# Patient Record
Sex: Female | Born: 1950 | Race: White | Hispanic: No | Marital: Married | Smoking: Former smoker
Health system: Southern US, Community
[De-identification: ages and names within clinical notes are randomized; demographics above are authoritative.]

## PROBLEM LIST (undated history)

## (undated) DIAGNOSIS — I10 Essential (primary) hypertension: Secondary | ICD-10-CM

## (undated) DIAGNOSIS — R519 Headache, unspecified: Secondary | ICD-10-CM

## (undated) DIAGNOSIS — R51 Headache: Secondary | ICD-10-CM

## (undated) DIAGNOSIS — R011 Cardiac murmur, unspecified: Secondary | ICD-10-CM

## (undated) DIAGNOSIS — L709 Acne, unspecified: Secondary | ICD-10-CM

## (undated) DIAGNOSIS — J42 Unspecified chronic bronchitis: Secondary | ICD-10-CM

## (undated) DIAGNOSIS — M1990 Unspecified osteoarthritis, unspecified site: Secondary | ICD-10-CM

## (undated) DIAGNOSIS — D649 Anemia, unspecified: Secondary | ICD-10-CM

## (undated) DIAGNOSIS — E119 Type 2 diabetes mellitus without complications: Secondary | ICD-10-CM

## (undated) DIAGNOSIS — K219 Gastro-esophageal reflux disease without esophagitis: Secondary | ICD-10-CM

## (undated) DIAGNOSIS — E7800 Pure hypercholesterolemia, unspecified: Secondary | ICD-10-CM

## (undated) DIAGNOSIS — E039 Hypothyroidism, unspecified: Secondary | ICD-10-CM

## (undated) HISTORY — PX: BACK SURGERY: SHX140

## (undated) HISTORY — PX: JOINT REPLACEMENT: SHX530

## (undated) HISTORY — DX: Anemia, unspecified: D64.9

## (undated) HISTORY — DX: Type 2 diabetes mellitus without complications: E11.9

## (undated) HISTORY — PX: LAPAROSCOPIC CHOLECYSTECTOMY: SUR755

---

## 1975-02-16 HISTORY — PX: TUBAL LIGATION: SHX77

## 2004-02-16 HISTORY — PX: ANTERIOR CERVICAL DECOMP/DISCECTOMY FUSION: SHX1161

## 2004-07-03 ENCOUNTER — Ambulatory Visit (HOSPITAL_COMMUNITY): Admission: AD | Admit: 2004-07-03 | Discharge: 2004-07-05 | Payer: Self-pay | Admitting: Neurosurgery

## 2015-02-16 HISTORY — PX: TOTAL ABDOMINAL HYSTERECTOMY: SHX209

## 2016-01-29 DIAGNOSIS — R42 Dizziness and giddiness: Secondary | ICD-10-CM | POA: Diagnosis not present

## 2017-02-17 DIAGNOSIS — M542 Cervicalgia: Secondary | ICD-10-CM | POA: Diagnosis not present

## 2017-02-18 DIAGNOSIS — M542 Cervicalgia: Secondary | ICD-10-CM | POA: Diagnosis not present

## 2017-02-26 DIAGNOSIS — J209 Acute bronchitis, unspecified: Secondary | ICD-10-CM | POA: Diagnosis not present

## 2017-02-26 DIAGNOSIS — J019 Acute sinusitis, unspecified: Secondary | ICD-10-CM | POA: Diagnosis not present

## 2017-02-28 DIAGNOSIS — M542 Cervicalgia: Secondary | ICD-10-CM | POA: Diagnosis not present

## 2017-03-04 DIAGNOSIS — M542 Cervicalgia: Secondary | ICD-10-CM | POA: Diagnosis not present

## 2017-03-07 DIAGNOSIS — M542 Cervicalgia: Secondary | ICD-10-CM | POA: Diagnosis not present

## 2017-03-10 DIAGNOSIS — M542 Cervicalgia: Secondary | ICD-10-CM | POA: Diagnosis not present

## 2017-04-07 DIAGNOSIS — Z6839 Body mass index (BMI) 39.0-39.9, adult: Secondary | ICD-10-CM | POA: Diagnosis not present

## 2017-04-07 DIAGNOSIS — E6609 Other obesity due to excess calories: Secondary | ICD-10-CM | POA: Diagnosis not present

## 2017-04-07 DIAGNOSIS — Z1231 Encounter for screening mammogram for malignant neoplasm of breast: Secondary | ICD-10-CM | POA: Diagnosis not present

## 2017-04-07 DIAGNOSIS — N958 Other specified menopausal and perimenopausal disorders: Secondary | ICD-10-CM | POA: Diagnosis not present

## 2017-04-07 DIAGNOSIS — M205X9 Other deformities of toe(s) (acquired), unspecified foot: Secondary | ICD-10-CM | POA: Diagnosis not present

## 2017-04-07 DIAGNOSIS — Z122 Encounter for screening for malignant neoplasm of respiratory organs: Secondary | ICD-10-CM | POA: Diagnosis not present

## 2017-04-07 DIAGNOSIS — Z23 Encounter for immunization: Secondary | ICD-10-CM | POA: Diagnosis not present

## 2017-04-07 DIAGNOSIS — Z0000 Encounter for general adult medical examination without abnormal findings: Secondary | ICD-10-CM | POA: Diagnosis not present

## 2017-04-07 DIAGNOSIS — N3941 Urge incontinence: Secondary | ICD-10-CM | POA: Diagnosis not present

## 2017-04-14 DIAGNOSIS — Z87891 Personal history of nicotine dependence: Secondary | ICD-10-CM | POA: Diagnosis not present

## 2017-04-18 DIAGNOSIS — L601 Onycholysis: Secondary | ICD-10-CM | POA: Diagnosis not present

## 2017-04-18 DIAGNOSIS — L814 Other melanin hyperpigmentation: Secondary | ICD-10-CM | POA: Diagnosis not present

## 2017-04-26 DIAGNOSIS — I10 Essential (primary) hypertension: Secondary | ICD-10-CM | POA: Diagnosis not present

## 2017-04-26 DIAGNOSIS — I700 Atherosclerosis of aorta: Secondary | ICD-10-CM | POA: Diagnosis not present

## 2017-04-26 DIAGNOSIS — E782 Mixed hyperlipidemia: Secondary | ICD-10-CM | POA: Diagnosis not present

## 2017-04-26 DIAGNOSIS — K219 Gastro-esophageal reflux disease without esophagitis: Secondary | ICD-10-CM | POA: Diagnosis not present

## 2017-04-26 DIAGNOSIS — E038 Other specified hypothyroidism: Secondary | ICD-10-CM | POA: Diagnosis not present

## 2017-04-26 DIAGNOSIS — K7581 Nonalcoholic steatohepatitis (NASH): Secondary | ICD-10-CM | POA: Diagnosis not present

## 2017-04-26 DIAGNOSIS — R7301 Impaired fasting glucose: Secondary | ICD-10-CM | POA: Diagnosis not present

## 2017-04-26 DIAGNOSIS — Z6838 Body mass index (BMI) 38.0-38.9, adult: Secondary | ICD-10-CM | POA: Diagnosis not present

## 2017-04-26 DIAGNOSIS — M160 Bilateral primary osteoarthritis of hip: Secondary | ICD-10-CM | POA: Diagnosis not present

## 2017-05-06 DIAGNOSIS — Z1231 Encounter for screening mammogram for malignant neoplasm of breast: Secondary | ICD-10-CM | POA: Diagnosis not present

## 2017-05-06 DIAGNOSIS — Z13820 Encounter for screening for osteoporosis: Secondary | ICD-10-CM | POA: Diagnosis not present

## 2017-05-06 DIAGNOSIS — N959 Unspecified menopausal and perimenopausal disorder: Secondary | ICD-10-CM | POA: Diagnosis not present

## 2017-05-10 DIAGNOSIS — M1611 Unilateral primary osteoarthritis, right hip: Secondary | ICD-10-CM | POA: Diagnosis not present

## 2017-05-31 DIAGNOSIS — L601 Onycholysis: Secondary | ICD-10-CM | POA: Diagnosis not present

## 2017-06-06 DIAGNOSIS — E669 Obesity, unspecified: Secondary | ICD-10-CM | POA: Diagnosis not present

## 2017-06-21 DIAGNOSIS — M1611 Unilateral primary osteoarthritis, right hip: Secondary | ICD-10-CM | POA: Diagnosis not present

## 2017-06-27 DIAGNOSIS — R52 Pain, unspecified: Secondary | ICD-10-CM | POA: Diagnosis not present

## 2017-06-27 DIAGNOSIS — Z8679 Personal history of other diseases of the circulatory system: Secondary | ICD-10-CM | POA: Diagnosis not present

## 2017-06-27 DIAGNOSIS — Z79899 Other long term (current) drug therapy: Secondary | ICD-10-CM | POA: Diagnosis not present

## 2017-06-27 DIAGNOSIS — Z01818 Encounter for other preprocedural examination: Secondary | ICD-10-CM | POA: Diagnosis not present

## 2017-06-27 DIAGNOSIS — M79609 Pain in unspecified limb: Secondary | ICD-10-CM | POA: Diagnosis not present

## 2017-07-06 DIAGNOSIS — E782 Mixed hyperlipidemia: Secondary | ICD-10-CM | POA: Diagnosis not present

## 2017-07-06 DIAGNOSIS — E1169 Type 2 diabetes mellitus with other specified complication: Secondary | ICD-10-CM | POA: Diagnosis not present

## 2017-07-06 DIAGNOSIS — Z01810 Encounter for preprocedural cardiovascular examination: Secondary | ICD-10-CM | POA: Diagnosis not present

## 2017-07-06 DIAGNOSIS — R0602 Shortness of breath: Secondary | ICD-10-CM | POA: Diagnosis not present

## 2017-07-12 DIAGNOSIS — L601 Onycholysis: Secondary | ICD-10-CM | POA: Diagnosis not present

## 2017-07-16 HISTORY — PX: TOTAL HIP ARTHROPLASTY: SHX124

## 2017-07-19 DIAGNOSIS — M1611 Unilateral primary osteoarthritis, right hip: Secondary | ICD-10-CM | POA: Diagnosis not present

## 2017-07-27 DIAGNOSIS — Z96641 Presence of right artificial hip joint: Secondary | ICD-10-CM | POA: Diagnosis not present

## 2017-07-27 DIAGNOSIS — Z7982 Long term (current) use of aspirin: Secondary | ICD-10-CM | POA: Diagnosis not present

## 2017-07-27 DIAGNOSIS — M1611 Unilateral primary osteoarthritis, right hip: Secondary | ICD-10-CM | POA: Diagnosis not present

## 2017-07-27 DIAGNOSIS — G8929 Other chronic pain: Secondary | ICD-10-CM | POA: Diagnosis present

## 2017-07-27 DIAGNOSIS — K589 Irritable bowel syndrome without diarrhea: Secondary | ICD-10-CM | POA: Diagnosis present

## 2017-07-27 DIAGNOSIS — Z79899 Other long term (current) drug therapy: Secondary | ICD-10-CM | POA: Diagnosis not present

## 2017-07-27 DIAGNOSIS — E669 Obesity, unspecified: Secondary | ICD-10-CM | POA: Diagnosis not present

## 2017-07-27 DIAGNOSIS — J3089 Other allergic rhinitis: Secondary | ICD-10-CM | POA: Diagnosis present

## 2017-07-27 DIAGNOSIS — E782 Mixed hyperlipidemia: Secondary | ICD-10-CM | POA: Diagnosis present

## 2017-07-27 DIAGNOSIS — Z87891 Personal history of nicotine dependence: Secondary | ICD-10-CM | POA: Diagnosis not present

## 2017-07-27 DIAGNOSIS — I10 Essential (primary) hypertension: Secondary | ICD-10-CM | POA: Diagnosis not present

## 2017-07-27 DIAGNOSIS — E1169 Type 2 diabetes mellitus with other specified complication: Secondary | ICD-10-CM | POA: Diagnosis present

## 2017-07-27 DIAGNOSIS — Z471 Aftercare following joint replacement surgery: Secondary | ICD-10-CM | POA: Diagnosis not present

## 2017-07-27 DIAGNOSIS — Z6839 Body mass index (BMI) 39.0-39.9, adult: Secondary | ICD-10-CM | POA: Diagnosis not present

## 2017-07-27 DIAGNOSIS — Z981 Arthrodesis status: Secondary | ICD-10-CM | POA: Diagnosis not present

## 2017-07-27 DIAGNOSIS — E039 Hypothyroidism, unspecified: Secondary | ICD-10-CM | POA: Diagnosis present

## 2017-07-27 DIAGNOSIS — K219 Gastro-esophageal reflux disease without esophagitis: Secondary | ICD-10-CM | POA: Diagnosis present

## 2017-07-31 DIAGNOSIS — K219 Gastro-esophageal reflux disease without esophagitis: Secondary | ICD-10-CM | POA: Diagnosis not present

## 2017-07-31 DIAGNOSIS — M1991 Primary osteoarthritis, unspecified site: Secondary | ICD-10-CM | POA: Diagnosis not present

## 2017-07-31 DIAGNOSIS — F329 Major depressive disorder, single episode, unspecified: Secondary | ICD-10-CM | POA: Diagnosis not present

## 2017-07-31 DIAGNOSIS — Z96641 Presence of right artificial hip joint: Secondary | ICD-10-CM | POA: Diagnosis not present

## 2017-07-31 DIAGNOSIS — Z7982 Long term (current) use of aspirin: Secondary | ICD-10-CM | POA: Diagnosis not present

## 2017-07-31 DIAGNOSIS — F419 Anxiety disorder, unspecified: Secondary | ICD-10-CM | POA: Diagnosis not present

## 2017-07-31 DIAGNOSIS — Z471 Aftercare following joint replacement surgery: Secondary | ICD-10-CM | POA: Diagnosis not present

## 2017-07-31 DIAGNOSIS — G8929 Other chronic pain: Secondary | ICD-10-CM | POA: Diagnosis not present

## 2017-07-31 DIAGNOSIS — E039 Hypothyroidism, unspecified: Secondary | ICD-10-CM | POA: Diagnosis not present

## 2017-07-31 DIAGNOSIS — I10 Essential (primary) hypertension: Secondary | ICD-10-CM | POA: Diagnosis not present

## 2017-07-31 DIAGNOSIS — Z79891 Long term (current) use of opiate analgesic: Secondary | ICD-10-CM | POA: Diagnosis not present

## 2017-08-02 DIAGNOSIS — G8929 Other chronic pain: Secondary | ICD-10-CM | POA: Diagnosis not present

## 2017-08-02 DIAGNOSIS — F329 Major depressive disorder, single episode, unspecified: Secondary | ICD-10-CM | POA: Diagnosis not present

## 2017-08-02 DIAGNOSIS — Z471 Aftercare following joint replacement surgery: Secondary | ICD-10-CM | POA: Diagnosis not present

## 2017-08-02 DIAGNOSIS — F419 Anxiety disorder, unspecified: Secondary | ICD-10-CM | POA: Diagnosis not present

## 2017-08-02 DIAGNOSIS — M1991 Primary osteoarthritis, unspecified site: Secondary | ICD-10-CM | POA: Diagnosis not present

## 2017-08-02 DIAGNOSIS — I10 Essential (primary) hypertension: Secondary | ICD-10-CM | POA: Diagnosis not present

## 2017-08-03 DIAGNOSIS — I10 Essential (primary) hypertension: Secondary | ICD-10-CM | POA: Diagnosis not present

## 2017-08-03 DIAGNOSIS — M1991 Primary osteoarthritis, unspecified site: Secondary | ICD-10-CM | POA: Diagnosis not present

## 2017-08-03 DIAGNOSIS — F329 Major depressive disorder, single episode, unspecified: Secondary | ICD-10-CM | POA: Diagnosis not present

## 2017-08-03 DIAGNOSIS — Z471 Aftercare following joint replacement surgery: Secondary | ICD-10-CM | POA: Diagnosis not present

## 2017-08-03 DIAGNOSIS — G8929 Other chronic pain: Secondary | ICD-10-CM | POA: Diagnosis not present

## 2017-08-03 DIAGNOSIS — F419 Anxiety disorder, unspecified: Secondary | ICD-10-CM | POA: Diagnosis not present

## 2017-08-04 DIAGNOSIS — G8929 Other chronic pain: Secondary | ICD-10-CM | POA: Diagnosis not present

## 2017-08-04 DIAGNOSIS — I10 Essential (primary) hypertension: Secondary | ICD-10-CM | POA: Diagnosis not present

## 2017-08-04 DIAGNOSIS — F419 Anxiety disorder, unspecified: Secondary | ICD-10-CM | POA: Diagnosis not present

## 2017-08-04 DIAGNOSIS — F329 Major depressive disorder, single episode, unspecified: Secondary | ICD-10-CM | POA: Diagnosis not present

## 2017-08-04 DIAGNOSIS — M1991 Primary osteoarthritis, unspecified site: Secondary | ICD-10-CM | POA: Diagnosis not present

## 2017-08-04 DIAGNOSIS — Z471 Aftercare following joint replacement surgery: Secondary | ICD-10-CM | POA: Diagnosis not present

## 2017-08-08 DIAGNOSIS — M1991 Primary osteoarthritis, unspecified site: Secondary | ICD-10-CM | POA: Diagnosis not present

## 2017-08-08 DIAGNOSIS — G8929 Other chronic pain: Secondary | ICD-10-CM | POA: Diagnosis not present

## 2017-08-08 DIAGNOSIS — F329 Major depressive disorder, single episode, unspecified: Secondary | ICD-10-CM | POA: Diagnosis not present

## 2017-08-08 DIAGNOSIS — I10 Essential (primary) hypertension: Secondary | ICD-10-CM | POA: Diagnosis not present

## 2017-08-08 DIAGNOSIS — Z471 Aftercare following joint replacement surgery: Secondary | ICD-10-CM | POA: Diagnosis not present

## 2017-08-08 DIAGNOSIS — F419 Anxiety disorder, unspecified: Secondary | ICD-10-CM | POA: Diagnosis not present

## 2017-09-01 DIAGNOSIS — M5412 Radiculopathy, cervical region: Secondary | ICD-10-CM | POA: Diagnosis not present

## 2017-09-08 DIAGNOSIS — Z96641 Presence of right artificial hip joint: Secondary | ICD-10-CM | POA: Diagnosis not present

## 2017-09-08 DIAGNOSIS — M1611 Unilateral primary osteoarthritis, right hip: Secondary | ICD-10-CM | POA: Diagnosis not present

## 2017-09-14 DIAGNOSIS — M5114 Intervertebral disc disorders with radiculopathy, thoracic region: Secondary | ICD-10-CM | POA: Diagnosis not present

## 2017-09-14 DIAGNOSIS — Z981 Arthrodesis status: Secondary | ICD-10-CM | POA: Diagnosis not present

## 2017-09-14 DIAGNOSIS — G9520 Unspecified cord compression: Secondary | ICD-10-CM | POA: Diagnosis not present

## 2017-09-14 DIAGNOSIS — M5013 Cervical disc disorder with radiculopathy, cervicothoracic region: Secondary | ICD-10-CM | POA: Diagnosis not present

## 2017-09-14 DIAGNOSIS — M4802 Spinal stenosis, cervical region: Secondary | ICD-10-CM | POA: Diagnosis not present

## 2017-09-14 DIAGNOSIS — M50123 Cervical disc disorder at C6-C7 level with radiculopathy: Secondary | ICD-10-CM | POA: Diagnosis not present

## 2017-09-16 DIAGNOSIS — M5013 Cervical disc disorder with radiculopathy, cervicothoracic region: Secondary | ICD-10-CM | POA: Diagnosis not present

## 2017-09-16 DIAGNOSIS — M5412 Radiculopathy, cervical region: Secondary | ICD-10-CM | POA: Diagnosis not present

## 2017-09-23 DIAGNOSIS — Z96641 Presence of right artificial hip joint: Secondary | ICD-10-CM | POA: Diagnosis not present

## 2017-09-27 DIAGNOSIS — M542 Cervicalgia: Secondary | ICD-10-CM | POA: Diagnosis not present

## 2017-09-27 DIAGNOSIS — M4802 Spinal stenosis, cervical region: Secondary | ICD-10-CM | POA: Diagnosis not present

## 2017-09-27 DIAGNOSIS — M5412 Radiculopathy, cervical region: Secondary | ICD-10-CM | POA: Diagnosis not present

## 2017-10-07 DIAGNOSIS — M5013 Cervical disc disorder with radiculopathy, cervicothoracic region: Secondary | ICD-10-CM | POA: Diagnosis not present

## 2017-10-07 DIAGNOSIS — M5011 Cervical disc disorder with radiculopathy,  high cervical region: Secondary | ICD-10-CM | POA: Diagnosis not present

## 2017-10-13 DIAGNOSIS — E039 Hypothyroidism, unspecified: Secondary | ICD-10-CM | POA: Diagnosis not present

## 2017-10-13 DIAGNOSIS — Z87891 Personal history of nicotine dependence: Secondary | ICD-10-CM | POA: Diagnosis not present

## 2017-10-13 DIAGNOSIS — I10 Essential (primary) hypertension: Secondary | ICD-10-CM | POA: Diagnosis not present

## 2017-10-13 DIAGNOSIS — M5030 Other cervical disc degeneration, unspecified cervical region: Secondary | ICD-10-CM | POA: Diagnosis not present

## 2017-10-13 DIAGNOSIS — Z7982 Long term (current) use of aspirin: Secondary | ICD-10-CM | POA: Diagnosis not present

## 2017-10-13 DIAGNOSIS — M62838 Other muscle spasm: Secondary | ICD-10-CM | POA: Diagnosis not present

## 2017-10-13 DIAGNOSIS — M542 Cervicalgia: Secondary | ICD-10-CM | POA: Diagnosis not present

## 2017-10-13 DIAGNOSIS — Z981 Arthrodesis status: Secondary | ICD-10-CM | POA: Diagnosis not present

## 2017-10-13 DIAGNOSIS — Z79899 Other long term (current) drug therapy: Secondary | ICD-10-CM | POA: Diagnosis not present

## 2017-10-13 DIAGNOSIS — M1990 Unspecified osteoarthritis, unspecified site: Secondary | ICD-10-CM | POA: Diagnosis not present

## 2017-10-19 DIAGNOSIS — M5412 Radiculopathy, cervical region: Secondary | ICD-10-CM | POA: Diagnosis not present

## 2017-10-21 DIAGNOSIS — Z96641 Presence of right artificial hip joint: Secondary | ICD-10-CM | POA: Diagnosis not present

## 2017-10-21 DIAGNOSIS — M50123 Cervical disc disorder at C6-C7 level with radiculopathy: Secondary | ICD-10-CM | POA: Diagnosis not present

## 2017-10-21 DIAGNOSIS — M5013 Cervical disc disorder with radiculopathy, cervicothoracic region: Secondary | ICD-10-CM | POA: Diagnosis not present

## 2017-10-21 DIAGNOSIS — M1611 Unilateral primary osteoarthritis, right hip: Secondary | ICD-10-CM | POA: Diagnosis not present

## 2017-10-21 DIAGNOSIS — M5011 Cervical disc disorder with radiculopathy,  high cervical region: Secondary | ICD-10-CM | POA: Diagnosis not present

## 2017-12-01 DIAGNOSIS — H52223 Regular astigmatism, bilateral: Secondary | ICD-10-CM | POA: Diagnosis not present

## 2017-12-01 DIAGNOSIS — H01119 Allergic dermatitis of unspecified eye, unspecified eyelid: Secondary | ICD-10-CM | POA: Diagnosis not present

## 2017-12-09 DIAGNOSIS — M542 Cervicalgia: Secondary | ICD-10-CM | POA: Diagnosis not present

## 2017-12-14 DIAGNOSIS — E1169 Type 2 diabetes mellitus with other specified complication: Secondary | ICD-10-CM | POA: Diagnosis not present

## 2017-12-14 DIAGNOSIS — G542 Cervical root disorders, not elsewhere classified: Secondary | ICD-10-CM | POA: Diagnosis not present

## 2017-12-14 DIAGNOSIS — M1612 Unilateral primary osteoarthritis, left hip: Secondary | ICD-10-CM | POA: Diagnosis not present

## 2017-12-14 DIAGNOSIS — Z6838 Body mass index (BMI) 38.0-38.9, adult: Secondary | ICD-10-CM | POA: Diagnosis not present

## 2017-12-14 DIAGNOSIS — E038 Other specified hypothyroidism: Secondary | ICD-10-CM | POA: Diagnosis not present

## 2017-12-14 DIAGNOSIS — E782 Mixed hyperlipidemia: Secondary | ICD-10-CM | POA: Diagnosis not present

## 2017-12-14 DIAGNOSIS — I700 Atherosclerosis of aorta: Secondary | ICD-10-CM | POA: Diagnosis not present

## 2017-12-14 DIAGNOSIS — K219 Gastro-esophageal reflux disease without esophagitis: Secondary | ICD-10-CM | POA: Diagnosis not present

## 2017-12-14 DIAGNOSIS — Z23 Encounter for immunization: Secondary | ICD-10-CM | POA: Diagnosis not present

## 2017-12-14 DIAGNOSIS — I10 Essential (primary) hypertension: Secondary | ICD-10-CM | POA: Diagnosis not present

## 2017-12-14 DIAGNOSIS — R7301 Impaired fasting glucose: Secondary | ICD-10-CM | POA: Diagnosis not present

## 2017-12-14 DIAGNOSIS — K7581 Nonalcoholic steatohepatitis (NASH): Secondary | ICD-10-CM | POA: Diagnosis not present

## 2017-12-19 DIAGNOSIS — E1169 Type 2 diabetes mellitus with other specified complication: Secondary | ICD-10-CM | POA: Diagnosis not present

## 2017-12-19 DIAGNOSIS — E038 Other specified hypothyroidism: Secondary | ICD-10-CM | POA: Diagnosis not present

## 2017-12-19 DIAGNOSIS — E782 Mixed hyperlipidemia: Secondary | ICD-10-CM | POA: Diagnosis not present

## 2017-12-19 DIAGNOSIS — I10 Essential (primary) hypertension: Secondary | ICD-10-CM | POA: Diagnosis not present

## 2017-12-19 DIAGNOSIS — G542 Cervical root disorders, not elsewhere classified: Secondary | ICD-10-CM | POA: Diagnosis not present

## 2017-12-27 DIAGNOSIS — M4802 Spinal stenosis, cervical region: Secondary | ICD-10-CM | POA: Diagnosis not present

## 2017-12-27 DIAGNOSIS — R29898 Other symptoms and signs involving the musculoskeletal system: Secondary | ICD-10-CM | POA: Diagnosis not present

## 2017-12-27 DIAGNOSIS — Z6838 Body mass index (BMI) 38.0-38.9, adult: Secondary | ICD-10-CM | POA: Diagnosis not present

## 2017-12-27 DIAGNOSIS — I10 Essential (primary) hypertension: Secondary | ICD-10-CM | POA: Diagnosis not present

## 2018-01-02 DIAGNOSIS — M5412 Radiculopathy, cervical region: Secondary | ICD-10-CM | POA: Diagnosis not present

## 2018-01-02 DIAGNOSIS — G5621 Lesion of ulnar nerve, right upper limb: Secondary | ICD-10-CM | POA: Diagnosis not present

## 2018-01-02 DIAGNOSIS — G5601 Carpal tunnel syndrome, right upper limb: Secondary | ICD-10-CM | POA: Diagnosis not present

## 2018-01-06 DIAGNOSIS — M542 Cervicalgia: Secondary | ICD-10-CM | POA: Diagnosis not present

## 2018-01-06 DIAGNOSIS — J384 Edema of larynx: Secondary | ICD-10-CM | POA: Diagnosis not present

## 2018-01-06 DIAGNOSIS — Z87891 Personal history of nicotine dependence: Secondary | ICD-10-CM | POA: Diagnosis not present

## 2018-01-06 DIAGNOSIS — J343 Hypertrophy of nasal turbinates: Secondary | ICD-10-CM | POA: Diagnosis not present

## 2018-01-09 DIAGNOSIS — L719 Rosacea, unspecified: Secondary | ICD-10-CM | POA: Diagnosis not present

## 2018-01-20 DIAGNOSIS — Z96641 Presence of right artificial hip joint: Secondary | ICD-10-CM | POA: Diagnosis not present

## 2018-01-20 DIAGNOSIS — K219 Gastro-esophageal reflux disease without esophagitis: Secondary | ICD-10-CM | POA: Diagnosis not present

## 2018-01-20 DIAGNOSIS — J018 Other acute sinusitis: Secondary | ICD-10-CM | POA: Diagnosis not present

## 2018-01-24 DIAGNOSIS — M4802 Spinal stenosis, cervical region: Secondary | ICD-10-CM | POA: Diagnosis not present

## 2018-01-31 DIAGNOSIS — H4301 Vitreous prolapse, right eye: Secondary | ICD-10-CM | POA: Diagnosis not present

## 2018-02-16 ENCOUNTER — Other Ambulatory Visit: Payer: Self-pay | Admitting: Neurological Surgery

## 2018-02-17 NOTE — Pre-Procedure Instructions (Signed)
KATIMA PASHIA  02/17/2018      [id number] - [place], [place] - [address] AT [place] [address] [place] [address] Phone: [phone] Fax: [phone]    Your procedure is scheduled on Friday, January 10th   Report to [facility] Admitting at 10:30AM             (posted surgery time 12:37p - 4:27p)   Call this number if you have problems the morning of surgery:  [phone]   Remember:  Do not eat any foods or drink any liquids after midnight, Thursday.                            7 days prior to surgery, STOP TAKING only, any Vitamins, Herbal Supplements, Anti-inflammatories, Blood thinners.    Take these medicines the morning of surgery with A SIP OF WATER : Tizanidine, Levothyroxine, Gabapentin, Nexium, Pain Pill.    Do not wear jewelry, make-up or nail polish.  Do not wear lotions, powders,  perfumes, or deodorant.  Do not shave 48 hours prior to surgery.    Do not bring valuables to the hospital.  [facility] is not responsible for any belongings or valuables.  Contacts, dentures or bridgework may not be worn into surgery.  Leave your suitcase in the car.  After surgery it may be brought to your room.  For patients admitted to the hospital, discharge time will be determined by your treatment team.  Patients discharged the day of surgery will not be allowed to drive home.   Please read over the following fact sheets that you were given. MRSA Information and Surgical Site Infection Prevention       [place]- Preparing For Surgery  Before surgery, you can play an important role. Because skin is not sterile, your skin needs to be as free of germs as possible. You can reduce the number of germs on your skin by washing with CHG (chlorahexidine gluconate) Soap before surgery.  CHG is an antiseptic cleaner which kills germs and bonds with the skin to continue killing germs even after washing.    Oral Hygiene is also  important to reduce your risk of infection.   Remember - BRUSH YOUR TEETH THE MORNING OF SURGERY WITH YOUR REGULAR TOOTHPASTE  Please do not use if you have an allergy to CHG or antibacterial soaps. If your skin becomes reddened/irritated stop using the CHG.  Do not shave (including legs and underarms) for at least 48 hours prior to first CHG shower. It is OK to shave your face.  Please follow these instructions carefully.   1. Shower the NIGHT BEFORE SURGERY and the MORNING OF SURGERY with CHG.   2. If you chose to wash your hair, wash your hair first as usual with your normal shampoo.  3. After you shampoo, rinse your hair and body thoroughly to remove the shampoo.  4. Use CHG as you would any other liquid soap. You can apply CHG directly to the skin and wash gently with a scrungie or a clean washcloth.   5. Apply the CHG Soap to your body ONLY FROM THE NECK DOWN.  Do not use on open wounds or open sores. Avoid contact with your eyes, ears, mouth and genitals (private parts). Wash Face and genitals (private parts)  with your normal soap.  6. Wash thoroughly, paying special attention to the area where  your surgery will be performed.  7. Thoroughly rinse your body with warm water from the neck down.  8. DO NOT shower/wash with your normal soap after using and rinsing off the CHG Soap.  9. Pat yourself dry with a CLEAN TOWEL.  10. Wear CLEAN PAJAMAS to bed the night before surgery, wear comfortable clothes the morning of surgery  11. Place CLEAN SHEETS on your bed the night of your first shower and DO NOT SLEEP WITH PETS.    Day of Surgery:  Do not apply any deodorants/lotions.  Please wear clean clothes to the hospital/surgery center.   Remember to brush your teeth WITH YOUR REGULAR TOOTHPASTE.

## 2018-02-20 ENCOUNTER — Encounter (HOSPITAL_COMMUNITY)
Admission: RE | Admit: 2018-02-20 | Discharge: 2018-02-20 | Disposition: A | Discharge status: Home / Self Care | Payer: Medicare Other | Source: Ambulatory Visit | Attending: Neurological Surgery | Admitting: Neurological Surgery

## 2018-02-20 ENCOUNTER — Other Ambulatory Visit: Payer: Self-pay

## 2018-02-20 ENCOUNTER — Encounter (HOSPITAL_COMMUNITY): Payer: Self-pay | Admitting: *Deleted

## 2018-02-20 DIAGNOSIS — Z01818 Encounter for other preprocedural examination: Secondary | ICD-10-CM | POA: Diagnosis not present

## 2018-02-20 HISTORY — DX: Hypothyroidism, unspecified: E03.9

## 2018-02-20 HISTORY — DX: Acne, unspecified: L70.9

## 2018-02-20 HISTORY — DX: Essential (primary) hypertension: I10

## 2018-02-20 HISTORY — DX: Gastro-esophageal reflux disease without esophagitis: K21.9

## 2018-02-20 HISTORY — DX: Cardiac murmur, unspecified: R01.1

## 2018-02-20 HISTORY — DX: Unspecified osteoarthritis, unspecified site: M19.90

## 2018-02-20 LAB — TYPE AND SCREEN
ABO/RH(D): A POS
Antibody Screen: NEGATIVE

## 2018-02-20 LAB — ABO/RH: ABO/RH(D): A POS

## 2018-02-20 LAB — BASIC METABOLIC PANEL
Anion gap: 12 (ref 5–15)
BUN: 11 mg/dL (ref 8–23)
CO2: 21 mmol/L — ABNORMAL LOW (ref 22–32)
Calcium: 9.8 mg/dL (ref 8.9–10.3)
Chloride: 103 mmol/L (ref 98–111)
Creatinine, Ser: 0.89 mg/dL (ref 0.44–1.00)
GFR calc Af Amer: >60 mL/min (ref >60)
GFR calc non Af Amer: >60 mL/min (ref >60)
Glucose, Bld: 104 mg/dL — ABNORMAL HIGH (ref 70–99)
Potassium: 3.9 mmol/L (ref 3.5–5.1)
Sodium: 136 mmol/L (ref 135–145)

## 2018-02-20 LAB — CBC
HCT: 40.5 % (ref 36.0–46.0)
Hemoglobin: 12.8 g/dL (ref 12.0–15.0)
MCH: 25.5 pg — ABNORMAL LOW (ref 26.0–34.0)
MCHC: 31.6 g/dL (ref 30.0–36.0)
MCV: 80.7 fL (ref 80.0–100.0)
Platelets: 343 10*3/uL (ref 150–400)
RBC: 5.02 MIL/uL (ref 3.87–5.11)
RDW: 18.3 % — ABNORMAL HIGH (ref 11.5–15.5)
WBC: 6.7 10*3/uL (ref 4.0–10.5)
nRBC: 0 % (ref 0.0–0.2)

## 2018-02-20 LAB — SURGICAL PCR SCREEN
MRSA, PCR: NEGATIVE
Staphylococcus aureus: NEGATIVE

## 2018-02-20 NOTE — Progress Notes (Signed)
PCP is Dr. Blane Ohara  ([facility] in [place])  LOV 12/2017 Denies chest pain, sob. She states that as a young girl, had "murmur", but has had no issues since the age of 72.  No cardiac w/u or testing done.

## 2018-02-20 NOTE — Anesthesia Preprocedure Evaluation (Addendum)
Anesthesia Evaluation  Patient identified by MRN, date of birth, ID band Patient awake    Reviewed: Allergy & Precautions, NPO status , Patient's Chart, lab work & pertinent test results  History of Anesthesia Complications Negative for: history of anesthetic complications  Airway Mallampati: II  TM Distance: >3 FB Neck ROM: Limited    Dental no notable dental hx. (+) Teeth Intact   Pulmonary neg pulmonary ROS, former smoker,    Pulmonary exam normal        Cardiovascular hypertension, Normal cardiovascular exam     Neuro/Psych negative neurological ROS  negative psych ROS   GI/Hepatic Neg liver ROS, GERD  ,  Endo/Other  Hypothyroidism   Renal/GU negative Renal ROS  negative genitourinary   Musculoskeletal negative musculoskeletal ROS (+)   Abdominal   Peds  Hematology negative hematology ROS (+)   Anesthesia Other Findings 68 yo F for C3-4 ACDF and posterior C7-T1 decompression/fusion - HTN, GERD, hypothyroid  Reproductive/Obstetrics                           Anesthesia Physical Anesthesia Plan  ASA: II  Anesthesia Plan: General   Post-op Pain Management:    Induction: Intravenous  PONV Risk Score and Plan: 3 and Ondansetron, Dexamethasone, Midazolam and Treatment may vary due to age or medical condition  Airway Management Planned: Oral ETT and Video Laryngoscope Planned  Additional Equipment: None  Intra-op Plan:   Post-operative Plan: Extubation in OR  Informed Consent: I have reviewed the patients History and Physical, chart, labs and discussed the procedure including the risks, benefits and alternatives for the proposed anesthesia with the patient or authorized representative who has indicated his/her understanding and acceptance.   Dental advisory given  Plan Discussed with:   Anesthesia Plan Comments: (Preop otolaryngology eval by Dr. Jenne Pane because she had previous  anterior cervical surgery. Per his note "The vocal folds both move symmetrically. She was reassured. She is cleared for cervical spine surgery through either side.")     Anesthesia Quick Evaluation

## 2018-02-23 ENCOUNTER — Encounter (HOSPITAL_COMMUNITY): Payer: Self-pay | Admitting: Registered Nurse

## 2018-02-24 ENCOUNTER — Inpatient Hospital Stay (HOSPITAL_COMMUNITY)
Admission: RE | Admit: 2018-02-24 | Discharge: 2018-02-25 | DRG: 455 | Disposition: A | Discharge status: Home / Self Care | Payer: Medicare Other | Attending: Neurosurgery | Admitting: Neurosurgery

## 2018-02-24 ENCOUNTER — Encounter (HOSPITAL_COMMUNITY): Payer: Self-pay | Admitting: *Deleted

## 2018-02-24 ENCOUNTER — Other Ambulatory Visit: Payer: Self-pay

## 2018-02-24 ENCOUNTER — Inpatient Hospital Stay (HOSPITAL_COMMUNITY): Payer: Medicare Other

## 2018-02-24 ENCOUNTER — Inpatient Hospital Stay (HOSPITAL_COMMUNITY): Payer: Medicare Other | Admitting: Physician Assistant

## 2018-02-24 ENCOUNTER — Inpatient Hospital Stay (HOSPITAL_COMMUNITY): Payer: Medicare Other | Admitting: Registered Nurse

## 2018-02-24 ENCOUNTER — Encounter (HOSPITAL_COMMUNITY)
Admission: RE | Disposition: A | Discharge status: Home / Self Care | Payer: Self-pay | Source: Home / Self Care | Attending: Neurological Surgery

## 2018-02-24 DIAGNOSIS — Z886 Allergy status to analgesic agent status: Secondary | ICD-10-CM | POA: Diagnosis not present

## 2018-02-24 DIAGNOSIS — Z885 Allergy status to narcotic agent status: Secondary | ICD-10-CM | POA: Diagnosis not present

## 2018-02-24 DIAGNOSIS — Z79890 Hormone replacement therapy (postmenopausal): Secondary | ICD-10-CM

## 2018-02-24 DIAGNOSIS — M1990 Unspecified osteoarthritis, unspecified site: Secondary | ICD-10-CM | POA: Diagnosis present

## 2018-02-24 DIAGNOSIS — E039 Hypothyroidism, unspecified: Secondary | ICD-10-CM | POA: Diagnosis present

## 2018-02-24 DIAGNOSIS — Z79899 Other long term (current) drug therapy: Secondary | ICD-10-CM | POA: Diagnosis not present

## 2018-02-24 DIAGNOSIS — M4803 Spinal stenosis, cervicothoracic region: Secondary | ICD-10-CM | POA: Diagnosis present

## 2018-02-24 DIAGNOSIS — I10 Essential (primary) hypertension: Secondary | ICD-10-CM | POA: Diagnosis present

## 2018-02-24 DIAGNOSIS — Z87891 Personal history of nicotine dependence: Secondary | ICD-10-CM

## 2018-02-24 DIAGNOSIS — K219 Gastro-esophageal reflux disease without esophagitis: Secondary | ICD-10-CM | POA: Diagnosis present

## 2018-02-24 DIAGNOSIS — M4722 Other spondylosis with radiculopathy, cervical region: Secondary | ICD-10-CM | POA: Diagnosis not present

## 2018-02-24 DIAGNOSIS — Z9049 Acquired absence of other specified parts of digestive tract: Secondary | ICD-10-CM

## 2018-02-24 DIAGNOSIS — Z419 Encounter for procedure for purposes other than remedying health state, unspecified: Secondary | ICD-10-CM

## 2018-02-24 DIAGNOSIS — M4723 Other spondylosis with radiculopathy, cervicothoracic region: Secondary | ICD-10-CM | POA: Diagnosis not present

## 2018-02-24 DIAGNOSIS — Z96641 Presence of right artificial hip joint: Secondary | ICD-10-CM | POA: Diagnosis present

## 2018-02-24 DIAGNOSIS — M542 Cervicalgia: Secondary | ICD-10-CM | POA: Diagnosis not present

## 2018-02-24 DIAGNOSIS — Z981 Arthrodesis status: Secondary | ICD-10-CM

## 2018-02-24 DIAGNOSIS — M4802 Spinal stenosis, cervical region: Principal | ICD-10-CM | POA: Diagnosis present

## 2018-02-24 DIAGNOSIS — Z90710 Acquired absence of both cervix and uterus: Secondary | ICD-10-CM | POA: Diagnosis not present

## 2018-02-24 HISTORY — DX: Headache, unspecified: R51.9

## 2018-02-24 HISTORY — PX: ANTERIOR / POSTERIOR COMBINED FUSION CERVICAL SPINE: SUR627

## 2018-02-24 HISTORY — DX: Headache: R51

## 2018-02-24 HISTORY — DX: Unspecified chronic bronchitis: J42

## 2018-02-24 HISTORY — DX: Pure hypercholesterolemia, unspecified: E78.00

## 2018-02-24 HISTORY — PX: POSTERIOR CERVICAL FUSION/FORAMINOTOMY: SHX5038

## 2018-02-24 HISTORY — PX: ANTERIOR CERVICAL DECOMP/DISCECTOMY FUSION: SHX1161

## 2018-02-24 SURGERY — ANTERIOR CERVICAL DECOMPRESSION/DISCECTOMY FUSION 1 LEVEL
Anesthesia: General

## 2018-02-24 MED ORDER — MIDAZOLAM HCL 2 MG/2ML IJ SOLN
INTRAMUSCULAR | Status: AC
  Filled 2018-02-24: qty 2

## 2018-02-24 MED ORDER — SODIUM CHLORIDE 0.9 % IV SOLN
INTRAVENOUS | Status: DC | PRN
  Administered 2018-02-24: 25 ug/min via INTRAVENOUS

## 2018-02-24 MED ORDER — ROCURONIUM BROMIDE 50 MG/5ML IV SOSY
PREFILLED_SYRINGE | INTRAVENOUS | Status: AC
  Filled 2018-02-24: qty 10

## 2018-02-24 MED ORDER — SODIUM CHLORIDE 0.9 % IV SOLN
INTRAVENOUS | Status: DC | PRN
  Administered 2018-02-24: 09:00:00

## 2018-02-24 MED ORDER — OXYCODONE HCL 5 MG/5ML PO SOLN: 5.0000 mg | Freq: Once | ORAL | Status: DC | PRN

## 2018-02-24 MED ORDER — HYDROMORPHONE HCL 1 MG/ML IJ SOLN
INTRAMUSCULAR | Status: AC
  Filled 2018-02-24: qty 1

## 2018-02-24 MED ORDER — OXYCODONE HCL 5 MG PO TABS: 5.0000 mg | ORAL_TABLET | Freq: Once | ORAL | Status: DC | PRN

## 2018-02-24 MED ORDER — POTASSIUM CHLORIDE IN NACL 20-0.9 MEQ/L-% IV SOLN
INTRAVENOUS | Status: DC
  Administered 2018-02-24: 17:00:00 via INTRAVENOUS
  Filled 2018-02-24: qty 1000

## 2018-02-24 MED ORDER — DEXAMETHASONE SODIUM PHOSPHATE 10 MG/ML IJ SOLN
INTRAMUSCULAR | Status: AC
  Filled 2018-02-24: qty 1

## 2018-02-24 MED ORDER — ONDANSETRON HCL 4 MG/2ML IJ SOLN: 4.0000 mg | Freq: Four times a day (QID) | INTRAMUSCULAR | Status: DC | PRN

## 2018-02-24 MED ORDER — BUPIVACAINE HCL (PF) 0.25 % IJ SOLN
INTRAMUSCULAR | Status: AC
  Filled 2018-02-24: qty 30

## 2018-02-24 MED ORDER — LABETALOL HCL 5 MG/ML IV SOLN
INTRAVENOUS | Status: DC | PRN
  Administered 2018-02-24: 5 mg via INTRAVENOUS

## 2018-02-24 MED ORDER — HYDROCODONE-ACETAMINOPHEN 5-325 MG PO TABS
1.0000 | ORAL_TABLET | Freq: Four times a day (QID) | ORAL | Status: DC | PRN
  Administered 2018-02-24 – 2018-02-25 (×3): 1 via ORAL
  Filled 2018-02-24 (×2): qty 1

## 2018-02-24 MED ORDER — FENTANYL CITRATE (PF) 250 MCG/5ML IJ SOLN
INTRAMUSCULAR | Status: AC
  Filled 2018-02-24: qty 5

## 2018-02-24 MED ORDER — MENTHOL 3 MG MT LOZG: 1.0000 | LOZENGE | OROMUCOSAL | Status: DC | PRN

## 2018-02-24 MED ORDER — SUGAMMADEX SODIUM 200 MG/2ML IV SOLN
INTRAVENOUS | Status: DC | PRN
  Administered 2018-02-24: 200 mg via INTRAVENOUS

## 2018-02-24 MED ORDER — ONDANSETRON HCL 4 MG/2ML IJ SOLN
INTRAMUSCULAR | Status: AC
  Filled 2018-02-24: qty 2

## 2018-02-24 MED ORDER — GABAPENTIN 300 MG PO CAPS
300.0000 mg | ORAL_CAPSULE | Freq: Two times a day (BID) | ORAL | Status: DC
  Administered 2018-02-24 – 2018-02-25 (×2): 300 mg via ORAL
  Filled 2018-02-24 (×2): qty 1

## 2018-02-24 MED ORDER — LEVOTHYROXINE SODIUM 50 MCG PO TABS
50.0000 ug | ORAL_TABLET | Freq: Every day | ORAL | Status: DC
  Administered 2018-02-25: 50 ug via ORAL
  Filled 2018-02-24: qty 1

## 2018-02-24 MED ORDER — BUPIVACAINE HCL (PF) 0.25 % IJ SOLN
INTRAMUSCULAR | Status: DC | PRN
  Administered 2018-02-24 (×2): 3 mL

## 2018-02-24 MED ORDER — 0.9 % SODIUM CHLORIDE (POUR BTL) OPTIME
TOPICAL | Status: DC | PRN
  Administered 2018-02-24: 1000 mL

## 2018-02-24 MED ORDER — CHLORHEXIDINE GLUCONATE CLOTH 2 % EX PADS: 6.0000 | MEDICATED_PAD | Freq: Once | CUTANEOUS | Status: DC

## 2018-02-24 MED ORDER — PROPOFOL 10 MG/ML IV BOLUS
INTRAVENOUS | Status: AC
  Filled 2018-02-24: qty 40

## 2018-02-24 MED ORDER — ACETAMINOPHEN 325 MG PO TABS: 650.0000 mg | ORAL_TABLET | ORAL | Status: DC | PRN

## 2018-02-24 MED ORDER — CEFAZOLIN SODIUM-DEXTROSE 2-4 GM/100ML-% IV SOLN
2.0000 g | INTRAVENOUS | Status: AC
  Administered 2018-02-24: 2 g via INTRAVENOUS

## 2018-02-24 MED ORDER — HYDROCODONE-ACETAMINOPHEN 5-325 MG PO TABS
ORAL_TABLET | ORAL | Status: AC
  Filled 2018-02-24: qty 1

## 2018-02-24 MED ORDER — DEXAMETHASONE SODIUM PHOSPHATE 4 MG/ML IJ SOLN
4.0000 mg | Freq: Four times a day (QID) | INTRAMUSCULAR | Status: DC
  Administered 2018-02-24: 4 mg via INTRAVENOUS
  Filled 2018-02-24: qty 1

## 2018-02-24 MED ORDER — CEFAZOLIN SODIUM-DEXTROSE 2-4 GM/100ML-% IV SOLN
2.0000 g | Freq: Three times a day (TID) | INTRAVENOUS | Status: AC
  Administered 2018-02-24 – 2018-02-25 (×2): 2 g via INTRAVENOUS
  Filled 2018-02-24 (×2): qty 100

## 2018-02-24 MED ORDER — DOXYCYCLINE HYCLATE 20 MG PO TABS: 20.0000 mg | ORAL_TABLET | Freq: Two times a day (BID) | ORAL | Status: DC

## 2018-02-24 MED ORDER — HYDROMORPHONE HCL 1 MG/ML IJ SOLN
0.2500 mg | INTRAMUSCULAR | Status: DC | PRN
  Administered 2018-02-24: 0.5 mg via INTRAVENOUS

## 2018-02-24 MED ORDER — ACETAMINOPHEN 10 MG/ML IV SOLN
INTRAVENOUS | Status: AC
  Filled 2018-02-24: qty 100

## 2018-02-24 MED ORDER — SODIUM CHLORIDE 0.9% FLUSH
3.0000 mL | Freq: Two times a day (BID) | INTRAVENOUS | Status: DC
  Administered 2018-02-24 – 2018-02-25 (×2): 3 mL via INTRAVENOUS

## 2018-02-24 MED ORDER — LACTATED RINGERS IV SOLN
INTRAVENOUS | Status: DC
  Administered 2018-02-24 (×2): via INTRAVENOUS

## 2018-02-24 MED ORDER — PROPOFOL 10 MG/ML IV BOLUS
INTRAVENOUS | Status: DC | PRN
  Administered 2018-02-24: 150 mg via INTRAVENOUS
  Administered 2018-02-24 (×2): 50 mg via INTRAVENOUS

## 2018-02-24 MED ORDER — PANTOPRAZOLE SODIUM 40 MG PO TBEC
40.0000 mg | DELAYED_RELEASE_TABLET | Freq: Every day | ORAL | Status: DC
  Filled 2018-02-24: qty 1

## 2018-02-24 MED ORDER — MORPHINE SULFATE (PF) 2 MG/ML IV SOLN
2.0000 mg | INTRAVENOUS | Status: DC | PRN
  Administered 2018-02-24: 2 mg via INTRAVENOUS
  Filled 2018-02-24: qty 1

## 2018-02-24 MED ORDER — PHENOL 1.4 % MT LIQD: 1.0000 | OROMUCOSAL | Status: DC | PRN

## 2018-02-24 MED ORDER — CEFAZOLIN SODIUM-DEXTROSE 2-4 GM/100ML-% IV SOLN
INTRAVENOUS | Status: AC
  Filled 2018-02-24: qty 100

## 2018-02-24 MED ORDER — THROMBIN 5000 UNITS EX SOLR
OROMUCOSAL | Status: DC | PRN
  Administered 2018-02-24: 09:00:00 via TOPICAL

## 2018-02-24 MED ORDER — LIDOCAINE 2% (20 MG/ML) 5 ML SYRINGE
INTRAMUSCULAR | Status: AC
  Filled 2018-02-24: qty 5

## 2018-02-24 MED ORDER — ONDANSETRON HCL 4 MG PO TABS: 4.0000 mg | ORAL_TABLET | Freq: Four times a day (QID) | ORAL | Status: DC | PRN

## 2018-02-24 MED ORDER — FENTANYL CITRATE (PF) 250 MCG/5ML IJ SOLN
INTRAMUSCULAR | Status: DC | PRN
  Administered 2018-02-24 (×3): 50 ug via INTRAVENOUS
  Administered 2018-02-24 (×2): 100 ug via INTRAVENOUS
  Administered 2018-02-24 (×2): 50 ug via INTRAVENOUS

## 2018-02-24 MED ORDER — LIDOCAINE 2% (20 MG/ML) 5 ML SYRINGE
INTRAMUSCULAR | Status: DC | PRN
  Administered 2018-02-24: 100 mg via INTRAVENOUS

## 2018-02-24 MED ORDER — THROMBIN 5000 UNITS EX SOLR
CUTANEOUS | Status: AC
  Filled 2018-02-24: qty 10000

## 2018-02-24 MED ORDER — ROCURONIUM BROMIDE 50 MG/5ML IV SOSY
PREFILLED_SYRINGE | INTRAVENOUS | Status: AC
  Filled 2018-02-24: qty 5

## 2018-02-24 MED ORDER — LISINOPRIL 10 MG PO TABS
10.0000 mg | ORAL_TABLET | Freq: Every day | ORAL | Status: DC
  Administered 2018-02-24: 10 mg via ORAL
  Filled 2018-02-24 (×2): qty 1

## 2018-02-24 MED ORDER — ONDANSETRON HCL 4 MG/2ML IJ SOLN: 4.0000 mg | Freq: Once | INTRAMUSCULAR | Status: DC | PRN

## 2018-02-24 MED ORDER — VANCOMYCIN HCL 1000 MG IV SOLR
INTRAVENOUS | Status: DC | PRN
  Administered 2018-02-24: 1000 mg

## 2018-02-24 MED ORDER — DEXAMETHASONE 4 MG PO TABS
4.0000 mg | ORAL_TABLET | Freq: Four times a day (QID) | ORAL | Status: DC
  Administered 2018-02-25 (×3): 4 mg via ORAL
  Filled 2018-02-24 (×3): qty 1

## 2018-02-24 MED ORDER — SODIUM CHLORIDE 0.9 % IV SOLN: 250.0000 mL | INTRAVENOUS | Status: DC

## 2018-02-24 MED ORDER — MIDAZOLAM HCL 5 MG/5ML IJ SOLN
INTRAMUSCULAR | Status: DC | PRN
  Administered 2018-02-24: 2 mg via INTRAVENOUS

## 2018-02-24 MED ORDER — SENNA 8.6 MG PO TABS
1.0000 | ORAL_TABLET | Freq: Two times a day (BID) | ORAL | Status: DC
  Administered 2018-02-24: 8.6 mg via ORAL
  Filled 2018-02-24 (×2): qty 1

## 2018-02-24 MED ORDER — DEXAMETHASONE SODIUM PHOSPHATE 10 MG/ML IJ SOLN
INTRAMUSCULAR | Status: DC | PRN
  Administered 2018-02-24: 10 mg via INTRAVENOUS

## 2018-02-24 MED ORDER — VANCOMYCIN HCL 1000 MG IV SOLR
INTRAVENOUS | Status: AC
  Filled 2018-02-24: qty 1000

## 2018-02-24 MED ORDER — HEMOSTATIC AGENTS (NO CHARGE) OPTIME
TOPICAL | Status: DC | PRN
  Administered 2018-02-24: 1 via TOPICAL

## 2018-02-24 MED ORDER — ONDANSETRON HCL 4 MG/2ML IJ SOLN
INTRAMUSCULAR | Status: DC | PRN
  Administered 2018-02-24: 4 mg via INTRAVENOUS

## 2018-02-24 MED ORDER — TIZANIDINE HCL 4 MG PO TABS
4.0000 mg | ORAL_TABLET | Freq: Three times a day (TID) | ORAL | Status: DC | PRN
  Administered 2018-02-24 – 2018-02-25 (×2): 4 mg via ORAL
  Filled 2018-02-24 (×2): qty 1

## 2018-02-24 MED ORDER — BACITRACIN ZINC 500 UNIT/GM EX OINT
TOPICAL_OINTMENT | CUTANEOUS | Status: AC
  Filled 2018-02-24: qty 28.35

## 2018-02-24 MED ORDER — ACETAMINOPHEN 10 MG/ML IV SOLN
INTRAVENOUS | Status: DC | PRN
  Administered 2018-02-24: 1000 mg via INTRAVENOUS

## 2018-02-24 MED ORDER — HYDROCHLOROTHIAZIDE 25 MG PO TABS
25.0000 mg | ORAL_TABLET | Freq: Every day | ORAL | Status: DC
  Filled 2018-02-24: qty 1

## 2018-02-24 MED ORDER — SODIUM CHLORIDE 0.9% FLUSH: 3.0000 mL | INTRAVENOUS | Status: DC | PRN

## 2018-02-24 MED ORDER — ACETAMINOPHEN 650 MG RE SUPP: 650.0000 mg | RECTAL | Status: DC | PRN

## 2018-02-24 MED ORDER — PHENYLEPHRINE 40 MCG/ML (10ML) SYRINGE FOR IV PUSH (FOR BLOOD PRESSURE SUPPORT)
PREFILLED_SYRINGE | INTRAVENOUS | Status: DC | PRN
  Administered 2018-02-24: 80 ug via INTRAVENOUS

## 2018-02-24 MED ORDER — ROCURONIUM BROMIDE 10 MG/ML (PF) SYRINGE
PREFILLED_SYRINGE | INTRAVENOUS | Status: DC | PRN
  Administered 2018-02-24: 50 mg via INTRAVENOUS
  Administered 2018-02-24: 20 mg via INTRAVENOUS
  Administered 2018-02-24: 30 mg via INTRAVENOUS
  Administered 2018-02-24 (×2): 20 mg via INTRAVENOUS

## 2018-02-24 MED ORDER — LABETALOL HCL 5 MG/ML IV SOLN
INTRAVENOUS | Status: AC
  Filled 2018-02-24: qty 8

## 2018-02-24 SURGICAL SUPPLY — 81 items
BAG DECANTER FOR FLEXI CONT (MISCELLANEOUS) ×4 IMPLANT
BASKET BONE COLLECTION (BASKET) ×2 IMPLANT
BENZOIN TINCTURE PRP APPL 2/3 (GAUZE/BANDAGES/DRESSINGS) ×6 IMPLANT
BIT DRILL 13 (BIT) IMPLANT
BIT DRILL 13MM (BIT)
BIT DRILL 2.4 (BIT) ×1 IMPLANT
BIT DRILL 2.4MM (BIT) ×1
BLADE CLIPPER SURG (BLADE) IMPLANT
BUR MATCHSTICK NEURO 3.0 LAGG (BURR) ×5 IMPLANT
CAGE PEEK 7X16X14 (Cage) ×1 IMPLANT
CAGE PEEK 7X16X14MM (Cage) ×1 IMPLANT
CANISTER SUCT 3000ML PPV (MISCELLANEOUS) ×4 IMPLANT
CARTRIDGE OIL MAESTRO DRILL (MISCELLANEOUS) ×2 IMPLANT
CLOSURE WOUND 1/2 X4 (GAUZE/BANDAGES/DRESSINGS) ×2
COVER WAND RF STERILE (DRAPES) ×4 IMPLANT
DERMABOND ADVANCED (GAUZE/BANDAGES/DRESSINGS) ×4
DERMABOND ADVANCED .7 DNX12 (GAUZE/BANDAGES/DRESSINGS) IMPLANT
DIFFUSER DRILL AIR PNEUMATIC (MISCELLANEOUS) ×4 IMPLANT
DRAPE C-ARM 42X72 X-RAY (DRAPES) ×12 IMPLANT
DRAPE LAPAROTOMY 100X72 PEDS (DRAPES) ×6 IMPLANT
DRAPE MICROSCOPE LEICA (MISCELLANEOUS) ×3 IMPLANT
DRAPE POUCH INSTRU U-SHP 10X18 (DRAPES) ×3 IMPLANT
DRSG OPSITE POSTOP 3X4 (GAUZE/BANDAGES/DRESSINGS) ×2 IMPLANT
DRSG OPSITE POSTOP 4X6 (GAUZE/BANDAGES/DRESSINGS) ×2 IMPLANT
DURAPREP 26ML APPLICATOR (WOUND CARE) ×3 IMPLANT
DURAPREP 6ML APPLICATOR 50/CS (WOUND CARE) ×3 IMPLANT
ELECT BLADE 4.0 EZ CLEAN MEGAD (MISCELLANEOUS) ×3
ELECT COATED BLADE 2.86 ST (ELECTRODE) ×3 IMPLANT
ELECT REM PT RETURN 9FT ADLT (ELECTROSURGICAL) ×6
ELECTRODE BLDE 4.0 EZ CLN MEGD (MISCELLANEOUS) IMPLANT
ELECTRODE REM PT RTRN 9FT ADLT (ELECTROSURGICAL) ×2 IMPLANT
EVACUATOR 1/8 PVC DRAIN (DRAIN) IMPLANT
GAUZE 4X4 16PLY RFD (DISPOSABLE) IMPLANT
GLOVE BIO SURGEON STRL SZ7 (GLOVE) IMPLANT
GLOVE BIO SURGEON STRL SZ8 (GLOVE) ×6 IMPLANT
GLOVE BIOGEL PI IND STRL 7.0 (GLOVE) IMPLANT
GLOVE BIOGEL PI IND STRL 7.5 (GLOVE) IMPLANT
GLOVE BIOGEL PI INDICATOR 7.0 (GLOVE)
GLOVE BIOGEL PI INDICATOR 7.5 (GLOVE) ×2
GLOVE SURG SS PI 7.0 STRL IVOR (GLOVE) ×6 IMPLANT
GOWN STRL REUS W/ TWL LRG LVL3 (GOWN DISPOSABLE) IMPLANT
GOWN STRL REUS W/ TWL XL LVL3 (GOWN DISPOSABLE) ×1 IMPLANT
GOWN STRL REUS W/TWL 2XL LVL3 (GOWN DISPOSABLE) ×3 IMPLANT
GOWN STRL REUS W/TWL LRG LVL3 (GOWN DISPOSABLE) ×2
GOWN STRL REUS W/TWL XL LVL3 (GOWN DISPOSABLE) ×4
HEMOSTAT POWDER KIT SURGIFOAM (HEMOSTASIS) ×3 IMPLANT
KIT BASIN OR (CUSTOM PROCEDURE TRAY) ×4 IMPLANT
KIT TURNOVER KIT B (KITS) ×6 IMPLANT
MARKER SKIN DUAL TIP RULER LAB (MISCELLANEOUS) ×1 IMPLANT
NDL HYPO 25X1 1.5 SAFETY (NEEDLE) ×2 IMPLANT
NDL SPNL 20GX3.5 QUINCKE YW (NEEDLE) ×1 IMPLANT
NEEDLE HYPO 25X1 1.5 SAFETY (NEEDLE) ×6 IMPLANT
NEEDLE SPNL 20GX3.5 QUINCKE YW (NEEDLE) ×3 IMPLANT
NS IRRIG 1000ML POUR BTL (IV SOLUTION) ×4 IMPLANT
OIL CARTRIDGE MAESTRO DRILL (MISCELLANEOUS) ×3
PACK LAMINECTOMY NEURO (CUSTOM PROCEDURE TRAY) ×6 IMPLANT
PAD ARMBOARD 7.5X6 YLW CONV (MISCELLANEOUS) ×6 IMPLANT
PIN DISTRACTION 14MM (PIN) ×6 IMPLANT
PIN MAYFIELD SKULL DISP (PIN) ×3 IMPLANT
PLATE ZEVO 1LVL 21MM (Plate) ×2 IMPLANT
PUTTY DBM GRAFTON 5CC (Putty) ×4 IMPLANT
ROD PRE CUT 3.5X25 (Rod) ×4 IMPLANT
RUBBERBAND STERILE (MISCELLANEOUS) ×6 IMPLANT
SCREW 13MM (Screw) ×8 IMPLANT
SCREW MA 4X22 (Screw) ×4 IMPLANT
SCREW MULTI AXIAL 3.5X14MM (Screw) ×4 IMPLANT
SCREW SET 3600315 STANDARD (Screw) ×12 IMPLANT
SCREW SET 3600315 STD (Screw) IMPLANT
SPONGE INTESTINAL PEANUT (DISPOSABLE) ×3 IMPLANT
SPONGE LAP 4X18 RFD (DISPOSABLE) IMPLANT
SPONGE SURGIFOAM ABS GEL SZ50 (HEMOSTASIS) ×8 IMPLANT
STRIP CLOSURE SKIN 1/2X4 (GAUZE/BANDAGES/DRESSINGS) ×4 IMPLANT
SUT VIC AB 0 CT1 18XCR BRD8 (SUTURE) ×1 IMPLANT
SUT VIC AB 0 CT1 8-18 (SUTURE) ×2
SUT VIC AB 2-0 CP2 18 (SUTURE) ×5 IMPLANT
SUT VIC AB 3-0 SH 8-18 (SUTURE) ×6 IMPLANT
SUT VICRYL 4-0 PS2 18IN ABS (SUTURE) IMPLANT
TOWEL GREEN STERILE (TOWEL DISPOSABLE) ×6 IMPLANT
TOWEL GREEN STERILE FF (TOWEL DISPOSABLE) ×6 IMPLANT
TRAY FOLEY MTR SLVR 16FR STAT (SET/KITS/TRAYS/PACK) ×2 IMPLANT
WATER STERILE IRR 1000ML POUR (IV SOLUTION) ×4 IMPLANT

## 2018-02-24 NOTE — Transfer of Care (Signed)
Immediate Anesthesia Transfer of Care Note  Patient: Megan Aguilar  Procedure(s) Performed: Anterior Cervical Decompression Fusion  - Cervical three-Cervical four (N/A ) Posterior cervical decompression and lateral mass fusion Cervical seven -Thoracic one (N/A )  Patient Location: PACU  Anesthesia Type:General  Level of Consciousness: awake, alert , oriented and patient cooperative  Airway & Oxygen Therapy: Patient Spontanous Breathing and Patient connected to face mask oxygen  Post-op Assessment: Report given to RN, Post -op Vital signs reviewed and stable and Patient moving all extremities X 4  Post vital signs: Reviewed and stable  Last Vitals:  Vitals Value Taken Time  BP 102/57 02/24/2018  2:16 PM  Temp 36.4 C 02/24/2018  2:15 PM  Pulse 75 02/24/2018  2:18 PM  Resp 14 02/24/2018  2:18 PM  SpO2 96 % 02/24/2018  2:18 PM  Vitals shown include unvalidated device data.  Last Pain:  Vitals:   02/24/18 0809  TempSrc:   PainSc: 7       Patients Stated Pain Goal: 3 (02/24/18 0809)  Complications: No apparent anesthesia complications

## 2018-02-24 NOTE — Op Note (Signed)
02/24/2018  2:07 PM  PATIENT:  Megan Aguilar  68 y.o. female  PRE-OPERATIVE DIAGNOSIS:  1.  Severe cervical spinal stenosis C3-4, 2.  Cervical spondylosis with subluxation C7-T1 with bilateral foraminal stenosis and bilateral C8 radiculopathy  POST-OPERATIVE DIAGNOSIS:  same  PROCEDURE:  1. Decompressive anterior cervical discectomy C3-4, 2. Anterior cervical arthrodesis C3-4 utilizing a peek interbody cage packed with locally harvested morcellized autologous bone graft, 3. Anterior cervical plating C3-4 utilizing a Medtronic plate, 4.  Posterior cervical bilateral C7-T1 foraminotomy with medial facetectomy and laminectomy, 5.  Posterior cervical fusion C7-T1 utilizing locally harvested morselized autologous bone graft and morselized allograft 6.  Nonsegmental fixation C7-T1 utilizing Medtronic lateral mass and pedicle screws  SURGEON:  Marikay Alar, MD  ASSISTANTS: Leo Grosser FNP  ANESTHESIA:   General  EBL: 150 ml  Total I/O In: 1000 [I.V.:1000] Out: 315 [Urine:165; Blood:150]  BLOOD ADMINISTERED: none  DRAINS: none  SPECIMEN:  none  INDICATION FOR PROCEDURE: This patient presented with bilateral hand numbness and arm pain with weakness in her left hand. Imaging showed severe spinal stenosis C3-4 and severe foraminal stenosis C7-T1 with subluxation.  Nerve conduction studies consistent with bilateral C8 radiculopathies.. The patient tried conservative measures without relief. Pain was debilitating. Recommended ACDF with plating C3-4 to address her severe spinal stenosis followed by posterior cervical decompression and instrumented fusion C7-T1. Patient understood the risks, benefits, and alternatives and potential outcomes and wished to proceed.  PROCEDURE DETAILS: Patient was brought to the operating room placed under general endotracheal anesthesia. Patient was placed in the supine position on the operating room table. The neck was prepped with Duraprep and draped in a sterile  fashion.   Three cc of local anesthesia was injected and a transverse incision was made on the right side of the neck.  Dissection was carried down thru the subcutaneous tissue and the platysma was  elevated, opened, and undermined with Metzenbaum scissors.  Dissection was then carried out thru an avascular plane leaving the sternocleidomastoid carotid artery and jugular vein laterally and the trachea and esophagus medially. The ventral aspect of the vertebral column was identified and a localizing x-ray was taken. The C3-4 level was identified. The longus colli muscles were then elevated and the retractor was placed. The annulus was incised and the disc space entered. Discectomy was performed with micro-curettes and pituitary rongeurs. I then used the high-speed drill to drill the endplates down to the level of the posterior longitudinal ligament. The drill shavings were saved in a mucous trap for later arthrodesis. The operating microscope was draped and brought into the field provided additional magnification, illumination and visualization. Discectomy was continued posteriorly thru the disc space. Posterior longitudinal ligament was opened with a nerve hook, and then removed along with disc herniation and osteophytes, decompressing the spinal canal and thecal sac. We then continued to remove osteophytic overgrowth and disc material decompressing the neural foramina and exiting nerve roots bilaterally. The scope was angled up and down to help decompress and undercut the vertebral bodies. Once the decompression was completed we could pass a nerve hook circumferentially to assure adequate decompression in the midline and in the neural foramina. So by both visualization and palpation we felt we had an adequate decompression of the neural elements. We then measured the height of the intravertebral disc space and selected a 8 millimeter peek interbody cage packed with autograft. It was then gently positioned in the  intravertebral disc space(s) and countersunk. I then used a Medtronic plate  and placed variable angle screws into the vertebral bodies of each level and locked them into position. The wound was irrigated with bacitracin solution, checked for hemostasis which was established and confirmed. Once meticulous hemostasis was achieved, we then proceeded with closure. The platysma was closed with interrupted 3-0 undyed Vicryl suture, the subcuticular layer was closed with interrupted 3-0 undyed Vicryl suture. The skin edges were approximated with steristrips. The drapes were removed. A sterile dressing was applied.  At the end of the procedure all sponge, needle and instrument counts were correct.  the patient was then repositioned on her bed.  the patient was affixed a 3 point Mayfield headrest and rolled into the prone position on chest rolls. All pressure points were padded. The posterior cervical region was cleaned and prepped with DuraPrep and then draped in the usual sterile fashion. 7 cc of local anesthesia was injected and a dorsal midline incision made in the posterior cervical region and carried down to the cervical fascia. The fascia was opened and the paraspinous musculature was taken down to expose C7-T1. Intraoperative fluoroscopy confirmed my level and then the dissection was carried out over the lateral facets. I localized the midpoint of each lateral mass and marked a region 1 mm medial to the midpoint of the lateral mass of C7, and then drilled in an upward and outward direction into the safe zone of each lateral mass. I drilled to a depth of 14 mm and then checked my drill hole with a ball probe. I then placed a 14 mm lateral mass screws into the safe zone of each lateral mass of C7 until they were 2 fingers tight. Kevin Fenton foraminotomies and medial facetectomies were performed at C7-T1 bilaterally.  With severe foraminal stenosis especially on the left.  Decompressed until I was distal to the pedicle  of T1.  I then marked the pedicle screw entry zone of T1 and utilizing surface landmarks and lateral fluoroscopy I drilled to a depth of 18 mm, palpated with a ball probe, tapped with a 3.5 tap and then placed 4.5 x 22 mm pedicle screws into the pedicles of T1 bilaterally. I then decorticated the lateral masses and the facet joints and packed them with local autograft and morcellized allograft to perform arthrodesis from C7-T1. I then placed rods into the multiaxial screw heads of the screws and locked these into position with the locking caps and anti-torque device. I then checked the final construct with AP/Lat fluoroscopy. I irrigated with saline solution containing bacitracin. I placed a medium Hemovac drain through separate stab incision, and lined the dura with Gelfoam. After hemostasis was achieved I closed the muscle and the fascia with 0 Vicryl, subcutaneous tissue with 2-0 Vicryl, and the subcuticular tissue with 3-0 Vicryl. The skin was closed with benzoin and Steri-Strips. A sterile dressing was applied, the patient was turned to the supine position and taken out of the headrest, awakened from general anesthesia and transferred to the recovery room in stable condition. At the end of the procedure all sponge, needle and instrument counts were correct.    PLAN OF CARE: Admit to inpatient   PATIENT DISPOSITION:  PACU - hemodynamically stable.   Delay start of Pharmacological VTE agent (>24hrs) due to surgical blood loss or risk of bleeding:  yes

## 2018-02-24 NOTE — H&P (Signed)
Subjective:   Patient is a 68 y.o. female admitted for neck pain and hand weakness. The patient first presented to me with complaints of neck pain, numbness of the arm(s) and loss of strength of the arm(s). Onset of symptoms was several months ago. The pain is described as aching and occurs all day. The pain is rated severe, and is located in the neck and radiates to the arms. The symptoms have been progressive. Symptoms are exacerbated by extending head backwards, and are relieved by none.  Previous work up includes MRI of cervical spine, results: spinal stenosis.  Past Medical History:  Diagnosis Date  . Adult acne   . Arthritis   . GERD (gastroesophageal reflux disease)   . Heart murmur    AS A 68 YR OLD...HASN'T BEEN A PROBLEM SINCE THEN.   . Hypertension   . Hypothyroidism     Past Surgical History:  Procedure Laterality Date  . ABDOMINAL HYSTERECTOMY     2017  . CERVICAL FUSION     2006  . CHOLECYSTECTOMY    . JOINT REPLACEMENT     RIGHT HIP  07/2017  . TUBAL LIGATION      Allergies  Allergen Reactions  . Celebrex [Celecoxib] Swelling  . Mobic [Meloxicam] Swelling  . Oxycodone Hcl Itching    Social History   Tobacco Use  . Smoking status: Former Smoker    Last attempt to quit: 02/21/2004    Years since quitting: 14.0  . Smokeless tobacco: Never Used  Substance Use Topics  . Alcohol use: Never    Frequency: Never    History reviewed. No pertinent family history. Prior to Admission medications   Medication Sig Start Date End Date Taking? Authorizing Provider  B Complex-C (B-COMPLEX WITH VITAMIN C) tablet Take 1 tablet by mouth daily.   Yes [provider]  Calcium Carb-Cholecalciferol (CALCIUM 600+D) 600-800 MG-UNIT TABS Take 1 tablet by mouth daily.   Yes [provider]  Coenzyme Q10 (CO Q-10) 200 MG CAPS Take 200 mg by mouth daily.   Yes [provider]  Cyanocobalamin (VITAMIN B-12 PO) Take 5,000 mcg by mouth daily.   Yes [provider]  doxycycline (PERIOSTAT) 20 MG tablet Take 20 mg by mouth 2 (two) times daily.   Yes [provider]  esomeprazole (NEXIUM) 40 MG capsule Take 40 mg by mouth daily at 12 noon.   Yes [provider]  fluticasone (FLONASE) 50 MCG/ACT nasal spray Place 2 sprays into both nostrils daily as needed for allergies or rhinitis.   Yes [provider]  gabapentin (NEURONTIN) 300 MG capsule Take 300 mg by mouth 2 (two) times daily.   Yes [provider]  Glucosamine-Chondroit-Vit C-Mn (GLUCOSAMINE 1500 COMPLEX PO) Take 3,000 mg by mouth daily.   Yes [provider]  hydrochlorothiazide (HYDRODIURIL) 25 MG tablet Take 25 mg by mouth daily.   Yes [provider]  HYDROcodone-acetaminophen (NORCO/VICODIN) 5-325 MG tablet Take 1 tablet by mouth every 6 (six) hours as needed for moderate pain or severe pain.   Yes [provider]  levothyroxine (SYNTHROID, LEVOTHROID) 50 MCG tablet Take 50 mcg by mouth daily before breakfast.   Yes [provider]  lisinopril (PRINIVIL,ZESTRIL) 10 MG tablet Take 10 mg by mouth daily.   Yes [provider]  magnesium oxide (MAG-OX) 400 MG tablet Take 800 mg by mouth daily.   Yes [provider]  Omega-3 Fatty Acids (FISH OIL) 1200 MG CAPS Take 1,200 mg by  mouth daily.   Yes [provider]  Red Yeast Rice 600 MG CAPS Take 1,200 mg by mouth daily.   Yes [provider]  tiZANidine (ZANAFLEX) 4 MG tablet Take 4 mg by mouth 3 (three) times daily as needed for muscle spasms.   Yes [provider]  TURMERIC PO Take 538 mg by mouth daily.   Yes [provider]     Review of Systems  Positive ROS: neg  All other systems have been reviewed and were otherwise negative with the exception of those mentioned in the HPI and as above.  Objective: Vital signs in last 24 hours: Temp:  [98 F (36.7 C)] 98 F (36.7 C) (01/10 0711) Pulse Rate:  [74] 74  (01/10 0711) Resp:  [20] 20 (01/10 0711) BP: (146)/(73) 146/73 (01/10 0711) SpO2:  [100 %] 100 % (01/10 0711) Weight:  [96.6 kg] 96.6 kg (01/10 0711)  General Appearance: Alert, cooperative, no distress, appears stated age Head: Normocephalic, without obvious abnormality, atraumatic Eyes: PERRL, conjunctiva/corneas clear, EOM's intact      Neck: Supple, symmetrical, trachea midline, Back: Symmetric, no curvature, ROM normal, no CVA tenderness Lungs:  respirations unlabored Heart: Regular rate and rhythm Abdomen: Soft, non-tender Extremities: Extremities normal, atraumatic, no cyanosis or edema Pulses: 2+ and symmetric all extremities Skin: Skin color, texture, turgor normal, no rashes or lesions  NEUROLOGIC:  Mental status: Alert and oriented x4, no aphasia, good attention span, fund of knowledge and memory  Motor Exam - grossly normal Sensory Exam - grossly normal Reflexes: 1+ Coordination - grossly normal Gait - grossly normal Balance - grossly normal Cranial Nerves: I: smell Not tested  II: visual acuity  OS: nl    OD: nl  II: visual fields Full to confrontation  II: pupils Equal, round, reactive to light  III,VII: ptosis None  III,IV,VI: extraocular muscles  Full ROM  V: mastication Normal  V: facial light touch sensation  Normal  V,VII: corneal reflex  Present  VII: facial muscle function - upper  Normal  VII: facial muscle function - lower Normal  VIII: hearing Not tested  IX: soft palate elevation  Normal  IX,X: gag reflex Present  XI: trapezius strength  5/5  XI: sternocleidomastoid strength 5/5  XI: neck flexion strength  5/5  XII: tongue strength  Normal    Data Review Lab Results  Component Value Date   WBC 6.7 02/20/2018   HGB 12.8 02/20/2018   HCT 40.5 02/20/2018   MCV 80.7 02/20/2018   PLT 343 02/20/2018   Lab Results  Component Value Date   NA 136 02/20/2018   K 3.9 02/20/2018   CL 103 02/20/2018   CO2 21 (L) 02/20/2018   BUN 11 02/20/2018    CREATININE 0.89 02/20/2018   GLUCOSE 104 (H) 02/20/2018   No results found for: INR, PROTIME  Assessment:   Cervical neck pain with herniated nucleus pulposus/ spondylosis/ stenosis at C3-4 and C7-T1. Estimated body mass index is 37.73 kg/m as calculated from the following:   Height as of this encounter: 5\' 3"  (1.6 m).   Weight as of this encounter: 96.6 kg.  Patient has failed conservative therapy. Planned surgery : ACDF C3-4, CL/ PCF C7-T1  Plan:   I explained the condition and procedure to the patient and answered any questions.  Patient wishes to proceed with procedure as planned. Understands risks/ benefits/ and expected or typical outcomes.  Tia Alert 02/24/2018 9:50 AM

## 2018-02-24 NOTE — Progress Notes (Signed)
Patient arrived to room in NAD, VS stable. Family at bedside.

## 2018-02-24 NOTE — Anesthesia Procedure Notes (Signed)
Procedure Name: Intubation Date/Time: 02/24/2018 10:34 AM Performed by: Waynard Edwards, CRNA Pre-anesthesia Checklist: Patient identified, Emergency Drugs available, Suction available and Patient being monitored Patient Re-evaluated:Patient Re-evaluated prior to induction Oxygen Delivery Method: Circle system utilized Preoxygenation: Pre-oxygenation with 100% oxygen Induction Type: IV induction Ventilation: Mask ventilation without difficulty and Oral airway inserted - appropriate to patient size Laryngoscope Size: Glidescope and 4 Grade View: Grade I Tube type: Oral Tube size: 7.0 mm Number of attempts: 1 Airway Equipment and Method: Video-laryngoscopy and Rigid stylet Placement Confirmation: ETT inserted through vocal cords under direct vision,  positive ETCO2 and breath sounds checked- equal and bilateral Secured at: 22 cm Tube secured with: Tape Dental Injury: Teeth and Oropharynx as per pre-operative assessment  Difficulty Due To: Difficult Airway- due to reduced neck mobility and Difficulty was anticipated

## 2018-02-24 NOTE — Anesthesia Postprocedure Evaluation (Signed)
Anesthesia Post Note  Patient: Megan Aguilar  Procedure(s) Performed: Anterior Cervical Decompression Fusion  - Cervical three-Cervical four (N/A ) Posterior cervical decompression and lateral mass fusion Cervical seven -Thoracic one (N/A )     Patient location during evaluation: PACU Anesthesia Type: General Level of consciousness: awake and alert Pain management: pain level controlled Vital Signs Assessment: post-procedure vital signs reviewed and stable Respiratory status: spontaneous breathing, nonlabored ventilation and respiratory function stable Cardiovascular status: blood pressure returned to baseline and stable Postop Assessment: no apparent nausea or vomiting Anesthetic complications: no    Last Vitals:  Vitals:   02/24/18 1530 02/24/18 1613  BP: (!) 151/69 (!) 157/80  Pulse: 87 88  Resp: 16   Temp:  36.4 C  SpO2: 94% 98%    Last Pain:  Vitals:   02/24/18 1613  TempSrc: Oral  PainSc: 9                  [place] [address] E Chamya Hunton

## 2018-02-25 MED ORDER — METHYLPREDNISOLONE 4 MG PO TBPK: ORAL_TABLET | ORAL | 0 refills | Status: DC

## 2018-02-25 MED ORDER — HYDROCODONE-ACETAMINOPHEN 5-325 MG PO TABS: 1.0000 | ORAL_TABLET | ORAL | 0 refills | Status: DC | PRN

## 2018-02-25 NOTE — Progress Notes (Signed)
Patient ID: Megan Aguilar, female   DOB: 1950/09/07, 68 y.o.   MRN: [id number] Looks really good, strength L hand much improved, expected soreness, maybe home today

## 2018-02-25 NOTE — Progress Notes (Signed)
  NEUROSURGERY PROGRESS NOTE   No issues overnight.  Complains of neck soreness and upper trap pain States she had a lot of pre op LUE pain and weakness which she believes is significantly better Has been up walking without difficulties.  Voiding normal. Tolerating po.  EXAM:  BP (!) 101/45 (BP Location: Right Arm)   Pulse 77   Temp 98.2 F (36.8 C) (Oral)   Resp 16   Ht 5\' 3"  (1.6 m)   Wt 96.6 kg   SpO2 94%   BMI 37.73 kg/m   Awake, alert, oriented  Speech fluent, appropriate  CN grossly intact  MAEW, mild LUE weakness, chronic LLE weakness particularly at hip stable Incisions with dried blood on bandage. No active bleeding. No swelling  PLAN Doing well today Is considering going home Will have her work with therapy. If continues to feel well, can be d/c home

## 2018-02-25 NOTE — Evaluation (Addendum)
Occupational Therapy Evaluation and DIscahrge Patient Details Name: Megan Aguilar MRN: [id number] DOB: 1950/10/02 Today's Date: 02/25/2018   History of Present Illness 68 y.o. female s/p ACDF C3/4; Posterior cervical fusion C7-T1.   Clinical Impression   Patient admitted for above.  PTA reports independent with ADLs (except for assist for donning bra).  Currently min assist for UB ADLs, min assist for LB ADLs, supervision for transfers and grooming.  Educated on precautions, ADL compensatory techniques, recommendations, safety, posture, and energy conservation.  Patient has support of spouse at discharge, and verbalizes understanding with recommendations.  Continues to presents with R shoulder weakness and impaired sensation, but reports L side grasp improved since surgery.  Educated on returning to ADLs with assist as needed, and request for OP OT visit if strength/sensation is not improving.  At this time, no further OT acute OT needs have been identified and OT is signing off.  Thank you for this referral.      Follow Up Recommendations  Follow surgeon's recommendation for DC plan and follow-up therapies;Supervision - Intermittent    Equipment Recommendations  None recommended by OT    Recommendations for Other Services       Precautions / Restrictions Precautions Precautions: Cervical Precaution Booklet Issued: Yes (comment) Precaution Comments: verbally reviewed with patient Required Braces or Orthoses: (no brace needed order ) Restrictions Weight Bearing Restrictions: No      Mobility Bed Mobility Overal bed mobility: Modified Independent             General bed mobility comments: seated OOB in recliner   Transfers Overall transfer level: Modified independent               General transfer comment: supervision for simulated shower transfers safety; good technique and body mechanics     Balance Overall balance assessment: Mild deficits observed, not  formally tested                                         ADL either performed or assessed with clinical judgement   ADL Overall ADL's : Needs assistance/impaired     Grooming: Supervision/safety;Cueing for compensatory techniques;Standing   Upper Body Bathing: Set up;Sitting   Lower Body Bathing: Supervison/ safety;With adaptive equipment;Cueing for back precautions;Cueing for compensatory techniques Lower Body Bathing Details (indicate cue type and reason): reviewed bathing seated with use of long sponge for LB Upper Body Dressing : Minimal assistance;Sitting Upper Body Dressing Details (indicate cue type and reason): min assist to fasten bra, good carryover of technique for donning shirt  Lower Body Dressing: Minimal assistance;Cueing for compensatory techniques;Sit to/from stand Lower Body Dressing Details (indicate cue type and reason): reviewed compensatory techniques and safety for LB dressing, donning R LE in pants first and completing seated; continues to require assist with socks Toilet Transfer: Modified Independent Toilet Transfer Details (indicate cue type and reason): simulated in room      Tub/ Shower Transfer: Walk-in shower;Supervision/safety;Ambulation Tub/Shower Transfer Details (indicate cue type and reason): simulated in room Functional mobility during ADLs: Supervision/safety       Vision Baseline Vision/History: Wears glasses Vision Assessment?: No apparent visual deficits     Perception     Praxis      Pertinent Vitals/Pain Pain Assessment: Faces Faces Pain Scale: Hurts even more Pain Location: neck and shoulders Pain Descriptors / Indicators: Sore Pain Intervention(s): Monitored during session  Hand Dominance Right   Extremity/Trunk Assessment Upper Extremity Assessment Upper Extremity Assessment: RUE deficits/detail;LUE deficits/detail RUE Deficits / Details: 3-/5 shoulder flexion, 3+/5 distal to shoulder (based on ROM  within precautions), decreased sensation R ulnar distribution  RUE Sensation: decreased light touch LUE Deficits / Details: grossly 3+/5 (ROM within restrictions), sensation intact  LUE Sensation: WNL   Lower Extremity Assessment Lower Extremity Assessment: Defer to PT evaluation   Cervical / Trunk Assessment Cervical / Trunk Assessment: Other exceptions Cervical / Trunk Exceptions: s/p cervical sx   Communication Communication Communication: No difficulties   Cognition Arousal/Alertness: Awake/alert Behavior During Therapy: WFL for tasks assessed/performed Overall Cognitive Status: Within Functional Limits for tasks assessed                                     General Comments       Exercises     Shoulder Instructions      Home Living Family/patient expects to be discharged to:: Private residence Living Arrangements: Spouse/significant other Available Help at Discharge: Family Type of Home: House Home Access: Stairs to enter Secretary/administrator of Steps: 2 Entrance Stairs-Rails: None Home Layout: One level     Bathroom Shower/Tub: Walk-in Human resources officer: Handicapped height     Home Equipment: Cane - single point;Shower seat          Prior Functioning/Environment Level of Independence: Independent        Comments: some assist with bra fastening        OT Problem List: Decreased strength;Decreased range of motion;Decreased activity tolerance;Impaired balance (sitting and/or standing);Decreased safety awareness;Decreased knowledge of use of DME or AE;Decreased knowledge of precautions;Pain;Impaired sensation      OT Treatment/Interventions:      OT Goals(Current goals can be found in the care plan section) Acute Rehab OT Goals Patient Stated Goal: home today OT Goal Formulation: With patient  OT Frequency:     Barriers to D/C:            Co-evaluation              AM-PAC OT "6 Clicks" Daily Activity      Outcome Measure Help from another person eating meals?: None Help from another person taking care of personal grooming?: None Help from another person toileting, which includes using toliet, bedpan, or urinal?: None Help from another person bathing (including washing, rinsing, drying)?: A Little Help from another person to put on and taking off regular upper body clothing?: None Help from another person to put on and taking off regular lower body clothing?: A Little 6 Click Score: 22   End of Session Nurse Communication: Mobility status  Activity Tolerance: Patient tolerated treatment well Patient left: in chair;with call bell/phone within reach;with family/visitor present  OT Visit Diagnosis: Muscle weakness (generalized) (M62.81);Pain Pain - part of body: (neck, shoulders)                Time: 1010-1030 OT Time Calculation (min): 20 min Charges:  OT General Charges $OT Visit: 1 Visit OT Evaluation $OT Eval Low Complexity: 1 Low  Chancy Milroy, OT Acute Rehabilitation Services Pager [phone] Office [phone]    Chancy Milroy 02/25/2018, 10:41 AM

## 2018-02-25 NOTE — Progress Notes (Signed)
Patient discharged per orders. Prescription for Norco given to patient's husband. All d/c instructions/teaching discussed with patient and her husband. Follow up appointments, medications, wound care, home care, activity restrictions discussed. Time allowed for questions and concerns. Patient and her husband verbalized understanding of d/c instructions.  Patient left the unit via wheelchair with RN and husband for transport to the main entrance.  Alwyn Ren RN

## 2018-02-25 NOTE — Evaluation (Signed)
Physical Therapy Evaluation Patient Details Name: Megan Aguilar MRN: [id number] DOB: 09-Mar-1950 Today's Date: 02/25/2018   History of Present Illness  68 y.o. female s/p ACDF C3/4; Posterior cervical fusion C7-T1/  Clinical Impression  Patient seen for mobility assessment s/p cervical spine surgery. Mobilizing well. Educated patient on precautions, mobility expectations, safety and car transfers. No further acute PT needs. Will sign off.     Follow Up Recommendations No PT follow up    Equipment Recommendations  None recommended by PT    Recommendations for Other Services       Precautions / Restrictions Precautions Precautions: Cervical Precaution Booklet Issued: Yes (comment) Precaution Comments: verbally reviewed with patient Restrictions Weight Bearing Restrictions: No      Mobility  Bed Mobility Overal bed mobility: Modified Independent                Transfers Overall transfer level: Modified independent                  Ambulation/Gait Ambulation/Gait assistance: Modified independent (Device/Increase time) Gait Distance (Feet): 510 Feet Assistive device: Rolling walker (2 wheeled) Gait Pattern/deviations: WFL(Within Functional Limits)     General Gait Details: RW for comfort, patient usually uses cane for mobility.   Stairs Stairs: Yes Stairs assistance: Supervision Stair Management: One rail Right Number of Stairs: 4 General stair comments: educated on technique for blind negotiation  Wheelchair Mobility    Modified Rankin (Stroke Patients Only)       Balance                                             Pertinent Vitals/Pain Pain Assessment: Faces Faces Pain Scale: Hurts even more Pain Location: neck and shoulders Pain Descriptors / Indicators: Sore Pain Intervention(s): Monitored during session    Home Living Family/patient expects to be discharged to:: Private residence Living Arrangements:  Spouse/significant other Available Help at Discharge: Family Type of Home: House Home Access: Stairs to enter Entrance Stairs-Rails: None Entrance Stairs-Number of Steps: 2 Home Layout: One level Home Equipment: Cane - single point;Shower seat      Prior Function Level of Independence: Independent         Comments: some assist with bra fastening     Hand Dominance   Dominant Hand: Right    Extremity/Trunk Assessment   Upper Extremity Assessment Upper Extremity Assessment: Defer to OT evaluation    Lower Extremity Assessment Lower Extremity Assessment: Overall WFL for tasks assessed       Communication   Communication: No difficulties  Cognition Arousal/Alertness: Awake/alert Behavior During Therapy: WFL for tasks assessed/performed Overall Cognitive Status: Within Functional Limits for tasks assessed                                        General Comments      Exercises     Assessment/Plan    PT Assessment Patent does not need any further PT services  PT Problem List         PT Treatment Interventions      PT Goals (Current goals can be found in the Care Plan section)  Acute Rehab PT Goals PT Goal Formulation: All assessment and education complete, DC therapy    Frequency     Barriers to discharge  Co-evaluation               AM-PAC PT "6 Clicks" Mobility  Outcome Measure Help needed turning from your back to your side while in a flat bed without using bedrails?: None Help needed moving from lying on your back to sitting on the side of a flat bed without using bedrails?: None Help needed moving to and from a bed to a chair (including a wheelchair)?: None Help needed standing up from a chair using your arms (e.g., wheelchair or bedside chair)?: None Help needed to walk in hospital room?: None Help needed climbing 3-5 steps with a railing? : A Little 6 Click Score: 23    End of Session   Activity Tolerance:  Patient tolerated treatment well Patient left: in chair;with call bell/phone within reach;with family/visitor present Nurse Communication: Mobility status PT Visit Diagnosis: Other symptoms and signs involving the nervous system (K44.010(R29.898)    Time: 2725-3664: 0711-0732 PT Time Calculation (min) (ACUTE ONLY): 21 min   Charges:   PT Evaluation $PT Eval Low Complexity: 1 Low          Charlotte Crumbevon Daryan Cagley, PT DPT  Board Certified Neurologic Specialist Acute Rehabilitation Services Pager [phone] Office [phone]   Fabio AsaDevon J Ryshawn Sanzone 02/25/2018, 7:40 AM

## 2018-02-25 NOTE — Discharge Summary (Signed)
Physician Discharge Summary  Patient ID: Megan Aguilar MRN: [id number] DOB/AGE: 10-18-1950 68 y.o.  Admit date: 02/24/2018 Discharge date: 02/25/2018  Admission Diagnoses:  Cervical stenosis  Discharge Diagnoses:  Same Active Problems:   S/P cervical spinal fusion   Discharged Condition: Stable  Hospital Course:  Megan Aguilar is a 68 y.o. female who was admitted for the below procedure. There were no post operative complications. At time of discharge, pain was well controlled, ambulating with Pt/OT, tolerating po, voiding normal. Ready for discharge.  Treatments: Surgery 1. Decompressive anterior cervical discectomy C3-4, 2. Anterior cervical arthrodesis C3-4 utilizing a peek interbody cage packed with locally harvested morcellized autologous bone graft, 3. Anterior cervical plating C3-4 utilizing a Medtronic plate, 4.  Posterior cervical bilateral C7-T1 foraminotomy with medial facetectomy and laminectomy, 5.  Posterior cervical fusion C7-T1 utilizing locally harvested morselized autologous bone graft and morselized allograft 6.  Nonsegmental fixation C7-T1 utilizing Medtronic lateral mass and pedicle screws  Discharge Exam: Blood pressure (!) 101/45, pulse 77, temperature 98.2 F (36.8 C), temperature source Oral, resp. rate 16, height 5\' 3"  (1.6 m), weight 96.6 kg, SpO2 94 %. Awake, alert, oriented  Speech fluent, appropriate  CN grossly intact  MAEW, mild LUE weakness, chronic LLE weakness particularly at hip stable Incisions with dried blood on bandage. No active bleeding. No swelling  Disposition:   Discharge Instructions    Call MD for:  difficulty breathing, headache or visual disturbances   Complete by:  As directed    Call MD for:  persistant dizziness or light-headedness   Complete by:  As directed    Call MD for:  redness, tenderness, or signs of infection (pain, swelling, redness, odor or green/yellow discharge around incision site)   Complete by:  As  directed    Call MD for:  severe uncontrolled pain   Complete by:  As directed    Call MD for:  temperature >100.4   Complete by:  As directed    Diet general   Complete by:  As directed    Driving Restrictions   Complete by:  As directed    Do not drive until given clearance.   Increase activity slowly   Complete by:  As directed    Lifting restrictions   Complete by:  As directed    Do not lift anything >10lbs. Avoid bending and twisting in awkward positions. Avoid bending at the back.   May shower / Bathe   Complete by:  As directed    In 24 hours. Okay to wash wound with warm soapy water. Avoid scrubbing the wound. Pat dry.   Remove dressing in 24 hours   Complete by:  As directed      Allergies as of 02/25/2018      Reactions   Celebrex [celecoxib] Swelling   Mobic [meloxicam] Swelling   Oxycodone Hcl Itching      Medication List    TAKE these medications   B-complex with vitamin C tablet Take 1 tablet by mouth daily.   CALCIUM 600+D 600-800 MG-UNIT Tabs Generic drug:  Calcium Carb-Cholecalciferol Take 1 tablet by mouth daily.   Co Q-10 200 MG Caps Take 200 mg by mouth daily.   doxycycline 20 MG tablet Commonly known as:  PERIOSTAT Take 20 mg by mouth 2 (two) times daily.   esomeprazole 40 MG capsule Commonly known as:  NEXIUM Take 40 mg by mouth daily at 12 noon.   Fish Oil 1200 MG Caps Take 1,200 mg by  mouth daily.   fluticasone 50 MCG/ACT nasal spray Commonly known as:  FLONASE Place 2 sprays into both nostrils daily as needed for allergies or rhinitis.   gabapentin 300 MG capsule Commonly known as:  NEURONTIN Take 300 mg by mouth 2 (two) times daily.   GLUCOSAMINE 1500 COMPLEX PO Take 3,000 mg by mouth daily.   hydrochlorothiazide 25 MG tablet Commonly known as:  HYDRODIURIL Take 25 mg by mouth daily.   HYDROcodone-acetaminophen 5-325 MG tablet Commonly known as:  NORCO/VICODIN Take 1 tablet by mouth every 4 (four) hours as needed for  moderate pain or severe pain. What changed:  when to take this   levothyroxine 50 MCG tablet Commonly known as:  SYNTHROID, LEVOTHROID Take 50 mcg by mouth daily before breakfast.   lisinopril 10 MG tablet Commonly known as:  PRINIVIL,ZESTRIL Take 10 mg by mouth daily.   magnesium oxide 400 MG tablet Commonly known as:  MAG-OX Take 800 mg by mouth daily.   methylPREDNISolone 4 MG Tbpk tablet Commonly known as:  MEDROL Take according to package insert   Red Yeast Rice 600 MG Caps Take 1,200 mg by mouth daily.   tiZANidine 4 MG tablet Commonly known as:  ZANAFLEX Take 4 mg by mouth 3 (three) times daily as needed for muscle spasms.   TURMERIC PO Take 538 mg by mouth daily.   VITAMIN B-12 PO Take 5,000 mcg by mouth daily.      Follow-up Information    Tia Alert, MD Follow up.   Specialty:  Neurosurgery Contact information: [address] [place] [address] [phone]           Signed: Alyson Ingles 02/25/2018, 6:55 AM

## 2018-02-25 NOTE — Care Management (Addendum)
Notified UR that patient is a potential CC44, awaiting to hear back. Lawerance Sabal RN BSN CPN Case Management [phone]  Per UR team, patient is not a CC44. OK to DC

## 2018-02-27 ENCOUNTER — Encounter (HOSPITAL_COMMUNITY): Payer: Self-pay | Admitting: Neurological Surgery

## 2018-03-15 DIAGNOSIS — L219 Seborrheic dermatitis, unspecified: Secondary | ICD-10-CM | POA: Diagnosis not present

## 2018-03-20 DIAGNOSIS — K219 Gastro-esophageal reflux disease without esophagitis: Secondary | ICD-10-CM | POA: Diagnosis not present

## 2018-03-20 DIAGNOSIS — Z6838 Body mass index (BMI) 38.0-38.9, adult: Secondary | ICD-10-CM | POA: Diagnosis not present

## 2018-03-20 DIAGNOSIS — I10 Essential (primary) hypertension: Secondary | ICD-10-CM | POA: Diagnosis not present

## 2018-03-20 DIAGNOSIS — I700 Atherosclerosis of aorta: Secondary | ICD-10-CM | POA: Diagnosis not present

## 2018-03-20 DIAGNOSIS — K7581 Nonalcoholic steatohepatitis (NASH): Secondary | ICD-10-CM | POA: Diagnosis not present

## 2018-03-20 DIAGNOSIS — E782 Mixed hyperlipidemia: Secondary | ICD-10-CM | POA: Diagnosis not present

## 2018-03-20 DIAGNOSIS — G4733 Obstructive sleep apnea (adult) (pediatric): Secondary | ICD-10-CM | POA: Diagnosis not present

## 2018-03-20 DIAGNOSIS — M542 Cervicalgia: Secondary | ICD-10-CM | POA: Diagnosis not present

## 2018-03-20 DIAGNOSIS — E038 Other specified hypothyroidism: Secondary | ICD-10-CM | POA: Diagnosis not present

## 2018-03-20 DIAGNOSIS — E1169 Type 2 diabetes mellitus with other specified complication: Secondary | ICD-10-CM | POA: Diagnosis not present

## 2018-04-10 DIAGNOSIS — Z6839 Body mass index (BMI) 39.0-39.9, adult: Secondary | ICD-10-CM | POA: Diagnosis not present

## 2018-04-10 DIAGNOSIS — Z0000 Encounter for general adult medical examination without abnormal findings: Secondary | ICD-10-CM | POA: Diagnosis not present

## 2018-04-10 DIAGNOSIS — Z1231 Encounter for screening mammogram for malignant neoplasm of breast: Secondary | ICD-10-CM | POA: Diagnosis not present

## 2018-04-13 DIAGNOSIS — L219 Seborrheic dermatitis, unspecified: Secondary | ICD-10-CM | POA: Diagnosis not present

## 2018-04-18 DIAGNOSIS — M5412 Radiculopathy, cervical region: Secondary | ICD-10-CM | POA: Diagnosis not present

## 2018-04-18 DIAGNOSIS — M4802 Spinal stenosis, cervical region: Secondary | ICD-10-CM | POA: Diagnosis not present

## 2018-04-20 DIAGNOSIS — M1612 Unilateral primary osteoarthritis, left hip: Secondary | ICD-10-CM | POA: Diagnosis not present

## 2018-04-26 DIAGNOSIS — Z79899 Other long term (current) drug therapy: Secondary | ICD-10-CM | POA: Diagnosis not present

## 2018-04-26 DIAGNOSIS — Z01818 Encounter for other preprocedural examination: Secondary | ICD-10-CM | POA: Diagnosis not present

## 2018-04-26 DIAGNOSIS — M79609 Pain in unspecified limb: Secondary | ICD-10-CM | POA: Diagnosis not present

## 2018-05-03 DIAGNOSIS — J060 Acute laryngopharyngitis: Secondary | ICD-10-CM | POA: Diagnosis not present

## 2018-07-11 DIAGNOSIS — L728 Other follicular cysts of the skin and subcutaneous tissue: Secondary | ICD-10-CM | POA: Diagnosis not present

## 2018-07-11 DIAGNOSIS — L219 Seborrheic dermatitis, unspecified: Secondary | ICD-10-CM | POA: Diagnosis not present

## 2018-07-21 DIAGNOSIS — I10 Essential (primary) hypertension: Secondary | ICD-10-CM | POA: Diagnosis not present

## 2018-07-21 DIAGNOSIS — E038 Other specified hypothyroidism: Secondary | ICD-10-CM | POA: Diagnosis not present

## 2018-07-21 DIAGNOSIS — E782 Mixed hyperlipidemia: Secondary | ICD-10-CM | POA: Diagnosis not present

## 2018-07-21 DIAGNOSIS — Z6838 Body mass index (BMI) 38.0-38.9, adult: Secondary | ICD-10-CM | POA: Diagnosis not present

## 2018-07-21 DIAGNOSIS — E1169 Type 2 diabetes mellitus with other specified complication: Secondary | ICD-10-CM | POA: Diagnosis not present

## 2018-07-25 DIAGNOSIS — E038 Other specified hypothyroidism: Secondary | ICD-10-CM | POA: Diagnosis not present

## 2018-07-25 DIAGNOSIS — E1169 Type 2 diabetes mellitus with other specified complication: Secondary | ICD-10-CM | POA: Diagnosis not present

## 2018-07-25 DIAGNOSIS — E782 Mixed hyperlipidemia: Secondary | ICD-10-CM | POA: Diagnosis not present

## 2018-07-25 DIAGNOSIS — I10 Essential (primary) hypertension: Secondary | ICD-10-CM | POA: Diagnosis not present

## 2018-08-01 DIAGNOSIS — R799 Abnormal finding of blood chemistry, unspecified: Secondary | ICD-10-CM | POA: Diagnosis not present

## 2018-08-01 DIAGNOSIS — M1612 Unilateral primary osteoarthritis, left hip: Secondary | ICD-10-CM | POA: Diagnosis not present

## 2018-08-10 DIAGNOSIS — I10 Essential (primary) hypertension: Secondary | ICD-10-CM | POA: Diagnosis not present

## 2018-08-10 DIAGNOSIS — E1169 Type 2 diabetes mellitus with other specified complication: Secondary | ICD-10-CM | POA: Diagnosis not present

## 2018-08-10 DIAGNOSIS — E782 Mixed hyperlipidemia: Secondary | ICD-10-CM | POA: Diagnosis not present

## 2018-09-12 DIAGNOSIS — E782 Mixed hyperlipidemia: Secondary | ICD-10-CM | POA: Diagnosis not present

## 2018-09-12 DIAGNOSIS — E1169 Type 2 diabetes mellitus with other specified complication: Secondary | ICD-10-CM | POA: Diagnosis not present

## 2018-09-12 DIAGNOSIS — I10 Essential (primary) hypertension: Secondary | ICD-10-CM | POA: Diagnosis not present

## 2018-11-10 DIAGNOSIS — Z23 Encounter for immunization: Secondary | ICD-10-CM | POA: Diagnosis not present

## 2018-11-24 DIAGNOSIS — R799 Abnormal finding of blood chemistry, unspecified: Secondary | ICD-10-CM | POA: Diagnosis not present

## 2018-11-24 DIAGNOSIS — E119 Type 2 diabetes mellitus without complications: Secondary | ICD-10-CM | POA: Diagnosis not present

## 2018-11-24 DIAGNOSIS — Z01810 Encounter for preprocedural cardiovascular examination: Secondary | ICD-10-CM | POA: Diagnosis not present

## 2018-11-24 DIAGNOSIS — E669 Obesity, unspecified: Secondary | ICD-10-CM | POA: Diagnosis not present

## 2018-11-24 DIAGNOSIS — Z01818 Encounter for other preprocedural examination: Secondary | ICD-10-CM | POA: Diagnosis not present

## 2018-11-24 DIAGNOSIS — Z7984 Long term (current) use of oral hypoglycemic drugs: Secondary | ICD-10-CM | POA: Diagnosis not present

## 2018-11-24 DIAGNOSIS — Z6837 Body mass index (BMI) 37.0-37.9, adult: Secondary | ICD-10-CM | POA: Diagnosis not present

## 2018-11-24 DIAGNOSIS — M1612 Unilateral primary osteoarthritis, left hip: Secondary | ICD-10-CM | POA: Diagnosis not present

## 2018-11-27 DIAGNOSIS — R001 Bradycardia, unspecified: Secondary | ICD-10-CM | POA: Diagnosis not present

## 2018-11-27 DIAGNOSIS — E119 Type 2 diabetes mellitus without complications: Secondary | ICD-10-CM | POA: Diagnosis not present

## 2018-11-27 DIAGNOSIS — R799 Abnormal finding of blood chemistry, unspecified: Secondary | ICD-10-CM | POA: Diagnosis not present

## 2018-11-27 DIAGNOSIS — Z7984 Long term (current) use of oral hypoglycemic drugs: Secondary | ICD-10-CM | POA: Diagnosis not present

## 2018-11-27 DIAGNOSIS — Z01818 Encounter for other preprocedural examination: Secondary | ICD-10-CM | POA: Diagnosis not present

## 2018-11-27 DIAGNOSIS — Z01810 Encounter for preprocedural cardiovascular examination: Secondary | ICD-10-CM | POA: Diagnosis not present

## 2018-11-27 DIAGNOSIS — M1612 Unilateral primary osteoarthritis, left hip: Secondary | ICD-10-CM | POA: Diagnosis not present

## 2018-12-01 DIAGNOSIS — Z1231 Encounter for screening mammogram for malignant neoplasm of breast: Secondary | ICD-10-CM | POA: Diagnosis not present

## 2018-12-07 DIAGNOSIS — E1169 Type 2 diabetes mellitus with other specified complication: Secondary | ICD-10-CM | POA: Diagnosis not present

## 2018-12-07 DIAGNOSIS — E038 Other specified hypothyroidism: Secondary | ICD-10-CM | POA: Diagnosis not present

## 2018-12-07 DIAGNOSIS — E782 Mixed hyperlipidemia: Secondary | ICD-10-CM | POA: Diagnosis not present

## 2018-12-07 DIAGNOSIS — Z01810 Encounter for preprocedural cardiovascular examination: Secondary | ICD-10-CM | POA: Diagnosis not present

## 2018-12-07 DIAGNOSIS — Z20828 Contact with and (suspected) exposure to other viral communicable diseases: Secondary | ICD-10-CM | POA: Diagnosis not present

## 2018-12-07 DIAGNOSIS — I10 Essential (primary) hypertension: Secondary | ICD-10-CM | POA: Diagnosis not present

## 2018-12-11 DIAGNOSIS — H5203 Hypermetropia, bilateral: Secondary | ICD-10-CM | POA: Diagnosis not present

## 2018-12-11 DIAGNOSIS — H2513 Age-related nuclear cataract, bilateral: Secondary | ICD-10-CM | POA: Diagnosis not present

## 2018-12-13 DIAGNOSIS — R799 Abnormal finding of blood chemistry, unspecified: Secondary | ICD-10-CM | POA: Diagnosis not present

## 2018-12-13 DIAGNOSIS — M1612 Unilateral primary osteoarthritis, left hip: Secondary | ICD-10-CM | POA: Diagnosis not present

## 2018-12-13 DIAGNOSIS — R7309 Other abnormal glucose: Secondary | ICD-10-CM | POA: Diagnosis not present

## 2018-12-14 DIAGNOSIS — R799 Abnormal finding of blood chemistry, unspecified: Secondary | ICD-10-CM | POA: Diagnosis not present

## 2018-12-14 DIAGNOSIS — R7309 Other abnormal glucose: Secondary | ICD-10-CM | POA: Diagnosis not present

## 2018-12-14 DIAGNOSIS — M1612 Unilateral primary osteoarthritis, left hip: Secondary | ICD-10-CM | POA: Diagnosis not present

## 2018-12-15 DIAGNOSIS — E039 Hypothyroidism, unspecified: Secondary | ICD-10-CM | POA: Diagnosis not present

## 2018-12-15 DIAGNOSIS — Z981 Arthrodesis status: Secondary | ICD-10-CM | POA: Diagnosis not present

## 2018-12-15 DIAGNOSIS — Z96643 Presence of artificial hip joint, bilateral: Secondary | ICD-10-CM | POA: Diagnosis not present

## 2018-12-15 DIAGNOSIS — Z7982 Long term (current) use of aspirin: Secondary | ICD-10-CM | POA: Diagnosis not present

## 2018-12-15 DIAGNOSIS — G8929 Other chronic pain: Secondary | ICD-10-CM | POA: Diagnosis not present

## 2018-12-15 DIAGNOSIS — Z7984 Long term (current) use of oral hypoglycemic drugs: Secondary | ICD-10-CM | POA: Diagnosis not present

## 2018-12-15 DIAGNOSIS — Z471 Aftercare following joint replacement surgery: Secondary | ICD-10-CM | POA: Diagnosis not present

## 2018-12-15 DIAGNOSIS — M542 Cervicalgia: Secondary | ICD-10-CM | POA: Diagnosis not present

## 2018-12-15 DIAGNOSIS — E785 Hyperlipidemia, unspecified: Secondary | ICD-10-CM | POA: Diagnosis not present

## 2018-12-15 DIAGNOSIS — E119 Type 2 diabetes mellitus without complications: Secondary | ICD-10-CM | POA: Diagnosis not present

## 2018-12-15 DIAGNOSIS — I10 Essential (primary) hypertension: Secondary | ICD-10-CM | POA: Diagnosis not present

## 2018-12-18 DIAGNOSIS — Z9889 Other specified postprocedural states: Secondary | ICD-10-CM | POA: Diagnosis not present

## 2018-12-18 DIAGNOSIS — I10 Essential (primary) hypertension: Secondary | ICD-10-CM | POA: Diagnosis not present

## 2018-12-18 DIAGNOSIS — M7989 Other specified soft tissue disorders: Secondary | ICD-10-CM | POA: Diagnosis not present

## 2018-12-18 DIAGNOSIS — R072 Precordial pain: Secondary | ICD-10-CM | POA: Diagnosis not present

## 2018-12-18 DIAGNOSIS — R0602 Shortness of breath: Secondary | ICD-10-CM | POA: Diagnosis not present

## 2018-12-18 DIAGNOSIS — I700 Atherosclerosis of aorta: Secondary | ICD-10-CM | POA: Diagnosis not present

## 2018-12-18 DIAGNOSIS — D649 Anemia, unspecified: Secondary | ICD-10-CM | POA: Diagnosis not present

## 2018-12-18 DIAGNOSIS — J9811 Atelectasis: Secondary | ICD-10-CM | POA: Diagnosis not present

## 2018-12-18 DIAGNOSIS — R9431 Abnormal electrocardiogram [ECG] [EKG]: Secondary | ICD-10-CM | POA: Diagnosis not present

## 2018-12-18 DIAGNOSIS — K7689 Other specified diseases of liver: Secondary | ICD-10-CM | POA: Diagnosis not present

## 2018-12-19 DIAGNOSIS — R0602 Shortness of breath: Secondary | ICD-10-CM | POA: Diagnosis not present

## 2018-12-19 DIAGNOSIS — M7989 Other specified soft tissue disorders: Secondary | ICD-10-CM | POA: Diagnosis not present

## 2018-12-19 DIAGNOSIS — R2242 Localized swelling, mass and lump, left lower limb: Secondary | ICD-10-CM | POA: Diagnosis not present

## 2018-12-20 DIAGNOSIS — Z96643 Presence of artificial hip joint, bilateral: Secondary | ICD-10-CM | POA: Diagnosis not present

## 2018-12-20 DIAGNOSIS — Z7982 Long term (current) use of aspirin: Secondary | ICD-10-CM | POA: Diagnosis not present

## 2018-12-20 DIAGNOSIS — G8929 Other chronic pain: Secondary | ICD-10-CM | POA: Diagnosis not present

## 2018-12-20 DIAGNOSIS — E039 Hypothyroidism, unspecified: Secondary | ICD-10-CM | POA: Diagnosis not present

## 2018-12-20 DIAGNOSIS — I10 Essential (primary) hypertension: Secondary | ICD-10-CM | POA: Diagnosis not present

## 2018-12-20 DIAGNOSIS — Z471 Aftercare following joint replacement surgery: Secondary | ICD-10-CM | POA: Diagnosis not present

## 2018-12-22 DIAGNOSIS — Z96643 Presence of artificial hip joint, bilateral: Secondary | ICD-10-CM | POA: Diagnosis not present

## 2018-12-22 DIAGNOSIS — I10 Essential (primary) hypertension: Secondary | ICD-10-CM | POA: Diagnosis not present

## 2018-12-22 DIAGNOSIS — Z471 Aftercare following joint replacement surgery: Secondary | ICD-10-CM | POA: Diagnosis not present

## 2018-12-22 DIAGNOSIS — G8929 Other chronic pain: Secondary | ICD-10-CM | POA: Diagnosis not present

## 2018-12-22 DIAGNOSIS — Z7982 Long term (current) use of aspirin: Secondary | ICD-10-CM | POA: Diagnosis not present

## 2018-12-22 DIAGNOSIS — E039 Hypothyroidism, unspecified: Secondary | ICD-10-CM | POA: Diagnosis not present

## 2018-12-26 DIAGNOSIS — Z7982 Long term (current) use of aspirin: Secondary | ICD-10-CM | POA: Diagnosis not present

## 2018-12-26 DIAGNOSIS — Z96643 Presence of artificial hip joint, bilateral: Secondary | ICD-10-CM | POA: Diagnosis not present

## 2018-12-26 DIAGNOSIS — Z471 Aftercare following joint replacement surgery: Secondary | ICD-10-CM | POA: Diagnosis not present

## 2018-12-26 DIAGNOSIS — I10 Essential (primary) hypertension: Secondary | ICD-10-CM | POA: Diagnosis not present

## 2018-12-26 DIAGNOSIS — E039 Hypothyroidism, unspecified: Secondary | ICD-10-CM | POA: Diagnosis not present

## 2018-12-26 DIAGNOSIS — G8929 Other chronic pain: Secondary | ICD-10-CM | POA: Diagnosis not present

## 2018-12-27 DIAGNOSIS — Z96642 Presence of left artificial hip joint: Secondary | ICD-10-CM | POA: Diagnosis not present

## 2018-12-27 DIAGNOSIS — Z471 Aftercare following joint replacement surgery: Secondary | ICD-10-CM | POA: Insufficient documentation

## 2018-12-28 DIAGNOSIS — I10 Essential (primary) hypertension: Secondary | ICD-10-CM | POA: Diagnosis not present

## 2018-12-28 DIAGNOSIS — Z7982 Long term (current) use of aspirin: Secondary | ICD-10-CM | POA: Diagnosis not present

## 2018-12-28 DIAGNOSIS — E039 Hypothyroidism, unspecified: Secondary | ICD-10-CM | POA: Diagnosis not present

## 2018-12-28 DIAGNOSIS — Z96643 Presence of artificial hip joint, bilateral: Secondary | ICD-10-CM | POA: Diagnosis not present

## 2018-12-28 DIAGNOSIS — Z471 Aftercare following joint replacement surgery: Secondary | ICD-10-CM | POA: Diagnosis not present

## 2018-12-28 DIAGNOSIS — G8929 Other chronic pain: Secondary | ICD-10-CM | POA: Diagnosis not present

## 2019-01-02 DIAGNOSIS — Z7982 Long term (current) use of aspirin: Secondary | ICD-10-CM | POA: Diagnosis not present

## 2019-01-02 DIAGNOSIS — Z471 Aftercare following joint replacement surgery: Secondary | ICD-10-CM | POA: Diagnosis not present

## 2019-01-02 DIAGNOSIS — I10 Essential (primary) hypertension: Secondary | ICD-10-CM | POA: Diagnosis not present

## 2019-01-02 DIAGNOSIS — E039 Hypothyroidism, unspecified: Secondary | ICD-10-CM | POA: Diagnosis not present

## 2019-01-02 DIAGNOSIS — Z96643 Presence of artificial hip joint, bilateral: Secondary | ICD-10-CM | POA: Diagnosis not present

## 2019-01-02 DIAGNOSIS — G8929 Other chronic pain: Secondary | ICD-10-CM | POA: Diagnosis not present

## 2019-02-10 DIAGNOSIS — Z03818 Encounter for observation for suspected exposure to other biological agents ruled out: Secondary | ICD-10-CM | POA: Diagnosis not present

## 2019-02-10 DIAGNOSIS — I214 Non-ST elevation (NSTEMI) myocardial infarction: Secondary | ICD-10-CM

## 2019-02-10 DIAGNOSIS — E1169 Type 2 diabetes mellitus with other specified complication: Secondary | ICD-10-CM | POA: Diagnosis not present

## 2019-02-10 DIAGNOSIS — I25119 Atherosclerotic heart disease of native coronary artery with unspecified angina pectoris: Secondary | ICD-10-CM | POA: Diagnosis not present

## 2019-02-10 DIAGNOSIS — R079 Chest pain, unspecified: Secondary | ICD-10-CM | POA: Diagnosis not present

## 2019-02-10 DIAGNOSIS — I10 Essential (primary) hypertension: Secondary | ICD-10-CM

## 2019-02-10 DIAGNOSIS — I249 Acute ischemic heart disease, unspecified: Secondary | ICD-10-CM

## 2019-02-11 ENCOUNTER — Other Ambulatory Visit: Payer: Self-pay

## 2019-02-11 ENCOUNTER — Inpatient Hospital Stay (HOSPITAL_COMMUNITY)
Admission: AD | Admit: 2019-02-11 | Discharge: 2019-02-12 | DRG: 282 | Disposition: A | Discharge status: Home / Self Care | Payer: Medicare Other | Source: Other Acute Inpatient Hospital | Attending: Cardiology | Admitting: Cardiology

## 2019-02-11 DIAGNOSIS — E7800 Pure hypercholesterolemia, unspecified: Secondary | ICD-10-CM

## 2019-02-11 DIAGNOSIS — K219 Gastro-esophageal reflux disease without esophagitis: Secondary | ICD-10-CM | POA: Diagnosis present

## 2019-02-11 DIAGNOSIS — R001 Bradycardia, unspecified: Secondary | ICD-10-CM | POA: Diagnosis not present

## 2019-02-11 DIAGNOSIS — Z79899 Other long term (current) drug therapy: Secondary | ICD-10-CM

## 2019-02-11 DIAGNOSIS — E785 Hyperlipidemia, unspecified: Secondary | ICD-10-CM | POA: Diagnosis present

## 2019-02-11 DIAGNOSIS — Z20828 Contact with and (suspected) exposure to other viral communicable diseases: Secondary | ICD-10-CM | POA: Diagnosis present

## 2019-02-11 DIAGNOSIS — Z79890 Hormone replacement therapy (postmenopausal): Secondary | ICD-10-CM | POA: Diagnosis not present

## 2019-02-11 DIAGNOSIS — Z79891 Long term (current) use of opiate analgesic: Secondary | ICD-10-CM | POA: Diagnosis not present

## 2019-02-11 DIAGNOSIS — Z888 Allergy status to other drugs, medicaments and biological substances status: Secondary | ICD-10-CM | POA: Diagnosis not present

## 2019-02-11 DIAGNOSIS — I2510 Atherosclerotic heart disease of native coronary artery without angina pectoris: Secondary | ICD-10-CM | POA: Diagnosis present

## 2019-02-11 DIAGNOSIS — I10 Essential (primary) hypertension: Secondary | ICD-10-CM | POA: Diagnosis not present

## 2019-02-11 DIAGNOSIS — I25110 Atherosclerotic heart disease of native coronary artery with unstable angina pectoris: Secondary | ICD-10-CM | POA: Diagnosis not present

## 2019-02-11 DIAGNOSIS — I214 Non-ST elevation (NSTEMI) myocardial infarction: Secondary | ICD-10-CM | POA: Diagnosis not present

## 2019-02-11 DIAGNOSIS — Z885 Allergy status to narcotic agent status: Secondary | ICD-10-CM | POA: Diagnosis not present

## 2019-02-11 DIAGNOSIS — E039 Hypothyroidism, unspecified: Secondary | ICD-10-CM | POA: Diagnosis present

## 2019-02-11 DIAGNOSIS — E119 Type 2 diabetes mellitus without complications: Secondary | ICD-10-CM | POA: Diagnosis not present

## 2019-02-11 LAB — CBC
HCT: 33.6 % — ABNORMAL LOW (ref 36.0–46.0)
Hemoglobin: 10.9 g/dL — ABNORMAL LOW (ref 12.0–15.0)
MCH: 26.1 pg (ref 26.0–34.0)
MCHC: 32.4 g/dL (ref 30.0–36.0)
MCV: 80.6 fL (ref 80.0–100.0)
Platelets: 308 10*3/uL (ref 150–400)
RBC: 4.17 MIL/uL (ref 3.87–5.11)
RDW: 15.2 % (ref 11.5–15.5)
WBC: 8 10*3/uL (ref 4.0–10.5)
nRBC: 0 % (ref 0.0–0.2)

## 2019-02-11 LAB — LIPID PANEL
Cholesterol: 178 mg/dL (ref 0–200)
HDL: 67 mg/dL (ref >40)
LDL Cholesterol: 90 mg/dL (ref 0–99)
Total CHOL/HDL Ratio: 2.7 RATIO
Triglycerides: 103 mg/dL (ref <150)
VLDL: 21 mg/dL (ref 0–40)

## 2019-02-11 LAB — BASIC METABOLIC PANEL
Anion gap: 10 (ref 5–15)
BUN: 13 mg/dL (ref 8–23)
CO2: 24 mmol/L (ref 22–32)
Calcium: 9.5 mg/dL (ref 8.9–10.3)
Chloride: 104 mmol/L (ref 98–111)
Creatinine, Ser: 0.93 mg/dL (ref 0.44–1.00)
GFR calc Af Amer: >60 mL/min (ref >60)
GFR calc non Af Amer: >60 mL/min (ref >60)
Glucose, Bld: 119 mg/dL — ABNORMAL HIGH (ref 70–99)
Potassium: 4.2 mmol/L (ref 3.5–5.1)
Sodium: 138 mmol/L (ref 135–145)

## 2019-02-11 LAB — GLUCOSE, CAPILLARY
Glucose-Capillary: 101 mg/dL — ABNORMAL HIGH (ref 70–99)
Glucose-Capillary: 99 mg/dL (ref 70–99)

## 2019-02-11 LAB — HIV ANTIBODY (ROUTINE TESTING W REFLEX): HIV Screen 4th Generation wRfx: NONREACTIVE

## 2019-02-11 LAB — SARS CORONAVIRUS 2 (TAT 6-24 HRS): SARS Coronavirus 2: NEGATIVE

## 2019-02-11 LAB — TROPONIN I (HIGH SENSITIVITY)
Troponin I (High Sensitivity): 1001 ng/L (ref <18)
Troponin I (High Sensitivity): 1079 ng/L (ref <18)

## 2019-02-11 LAB — HEPARIN LEVEL (UNFRACTIONATED)
Heparin Unfractionated: 0.19 IU/mL — ABNORMAL LOW (ref 0.30–0.70)
Heparin Unfractionated: 0.14 IU/mL — ABNORMAL LOW (ref 0.30–0.70)

## 2019-02-11 LAB — MRSA PCR SCREENING: MRSA by PCR: NEGATIVE

## 2019-02-11 LAB — TSH: TSH: 2.832 u[IU]/mL (ref 0.350–4.500)

## 2019-02-11 LAB — HEMOGLOBIN A1C
Hgb A1c MFr Bld: 6 % — ABNORMAL HIGH (ref 4.8–5.6)
Mean Plasma Glucose: 125.5 mg/dL

## 2019-02-11 MED ORDER — LEVOTHYROXINE SODIUM 50 MCG PO TABS
50.0000 ug | ORAL_TABLET | Freq: Every day | ORAL | Status: DC
  Administered 2019-02-12: 06:00:00 50 ug via ORAL
  Filled 2019-02-11: qty 1

## 2019-02-11 MED ORDER — SODIUM CHLORIDE 0.9% FLUSH: 3.0000 mL | INTRAVENOUS | Status: DC | PRN

## 2019-02-11 MED ORDER — HEPARIN (PORCINE) 25000 UT/250ML-% IV SOLN
1350.0000 [IU]/h | INTRAVENOUS | Status: DC
  Administered 2019-02-11: 900 [IU]/h via INTRAVENOUS
  Administered 2019-02-12: 1200 [IU]/h via INTRAVENOUS
  Filled 2019-02-11: qty 250

## 2019-02-11 MED ORDER — ONDANSETRON HCL 4 MG/2ML IJ SOLN: 4.0000 mg | Freq: Four times a day (QID) | INTRAMUSCULAR | Status: DC | PRN

## 2019-02-11 MED ORDER — ATORVASTATIN CALCIUM 80 MG PO TABS
80.0000 mg | ORAL_TABLET | Freq: Every day | ORAL | Status: DC
  Administered 2019-02-11: 80 mg via ORAL
  Filled 2019-02-11: qty 1

## 2019-02-11 MED ORDER — INSULIN ASPART 100 UNIT/ML ~~LOC~~ SOLN: 0.0000 [IU] | Freq: Every day | SUBCUTANEOUS | Status: DC

## 2019-02-11 MED ORDER — ASPIRIN 81 MG PO CHEW
81.0000 mg | CHEWABLE_TABLET | Freq: Every day | ORAL | Status: DC
  Administered 2019-02-11 – 2019-02-12 (×2): 81 mg via ORAL
  Filled 2019-02-11 (×2): qty 1

## 2019-02-11 MED ORDER — GABAPENTIN 300 MG PO CAPS
300.0000 mg | ORAL_CAPSULE | Freq: Every day | ORAL | Status: DC
  Administered 2019-02-11: 300 mg via ORAL
  Filled 2019-02-11: qty 1

## 2019-02-11 MED ORDER — SODIUM CHLORIDE 0.9 % WEIGHT BASED INFUSION
1.0000 mL/kg/h | INTRAVENOUS | Status: DC
  Administered 2019-02-11: 1 mL/kg/h via INTRAVENOUS

## 2019-02-11 MED ORDER — SODIUM CHLORIDE 0.9% FLUSH: 3.0000 mL | Freq: Two times a day (BID) | INTRAVENOUS | Status: DC

## 2019-02-11 MED ORDER — PANTOPRAZOLE SODIUM 40 MG PO TBEC
40.0000 mg | DELAYED_RELEASE_TABLET | Freq: Every day | ORAL | Status: DC
  Administered 2019-02-11: 40 mg via ORAL
  Filled 2019-02-11: qty 1

## 2019-02-11 MED ORDER — ACETAMINOPHEN 325 MG PO TABS: 650.0000 mg | ORAL_TABLET | ORAL | Status: DC | PRN

## 2019-02-11 MED ORDER — INSULIN ASPART 100 UNIT/ML ~~LOC~~ SOLN: 0.0000 [IU] | Freq: Three times a day (TID) | SUBCUTANEOUS | Status: DC

## 2019-02-11 MED ORDER — NITROGLYCERIN 0.4 MG SL SUBL: 0.4000 mg | SUBLINGUAL_TABLET | SUBLINGUAL | Status: DC | PRN

## 2019-02-11 MED ORDER — SODIUM CHLORIDE 0.9 % IV SOLN: 250.0000 mL | INTRAVENOUS | Status: DC | PRN

## 2019-02-11 MED ORDER — METOPROLOL SUCCINATE ER 50 MG PO TB24
75.0000 mg | ORAL_TABLET | Freq: Every day | ORAL | Status: DC
  Administered 2019-02-11: 75 mg via ORAL
  Filled 2019-02-11: qty 1

## 2019-02-11 NOTE — Progress Notes (Signed)
[place] for heparin Indication: chest pain/ACS  Allergies  Allergen Reactions  . Celebrex [Celecoxib] Swelling  . Mobic [Meloxicam] Swelling  . Oxycodone Hcl Itching    Patient Measurements:    Vital Signs: Temp: 98.2 F (36.8 C) (12/27 1209) Temp Source: Oral (12/27 1209) BP: 113/67 (12/27 1209) Pulse Rate: 56 (12/27 1209)  Labs: Recent Labs    02/11/19 0257 02/11/19 0520 02/11/19 0703  HGB 10.9*  --   --   HCT 33.6*  --   --   PLT 308  --   --   HEPARINUNFRC  --   --  0.19*  TROPONINIHS 1,001* 1,079*  --     CrCl cannot be calculated (Patient's most recent lab result is older than the maximum 21 days allowed.).   Medical History: Past Medical History:  Diagnosis Date  . Adult acne   . Arthritis    "all over" (02/24/2018)  . Chronic bronchitis ([place])   . GERD (gastroesophageal reflux disease)   . Headache    "at least 1/wk" (02/24/2018)  . Heart murmur    AS A 68 YR OLD...HASN'T BEEN A PROBLEM SINCE THEN.   . High cholesterol   . Hypertension   . Hypothyroidism     Medications:  . heparin 1,050 Units/hr (02/11/19 1018)     Assessment: 68 yo female admitted from OSH with chest pain, planning cath lab tomorrow.  Heparin level below goal this AM on heparin at 900 units/hr.  No bleeding or complications noted, CBC WNL.  Goal of Therapy:  Heparin level 0.3-0.7 units/ml Monitor platelets by anticoagulation protocol: Yes   Plan:  Increase IV heparin to 1050 units/hr Recheck heparin level in 8 hrs. Daily heparin level and CBC.  Marguerite Olea, [facility] Clinical Pharmacist Phone [phone]  02/11/2019 12:32 PM

## 2019-02-11 NOTE — Progress Notes (Signed)
Patient arrived via [facility] from [place] to 2C03. MD notified of arrival and orders received. Patient stable at this time. Ekg done and placed in chart. I will continue to monitor.

## 2019-02-11 NOTE — Progress Notes (Signed)
[place] for heparin Indication: chest pain/ACS  Allergies  Allergen Reactions  . Banana Other (See Comments)    Upset stomach   . Cantaloupe (Diagnostic) Other (See Comments)    Upset stomach   . Celebrex [Celecoxib] Swelling  . Egg [Eggs Or Egg-Derived Products] Other (See Comments)    Upset stomach   . Mobic [Meloxicam] Swelling  . Oxycodone Hcl Itching  . Pineapple Other (See Comments)    Upset stomach    Patient Measurements: Weight: 203 lb 0.7 oz (92.1 kg)  Vital Signs: Temp: 98.1 F (36.7 C) (12/27 1946) Temp Source: Oral (12/27 1946) BP: 136/74 (12/27 1946) Pulse Rate: 54 (12/27 1946)  Labs: Recent Labs    02/11/19 0257 02/11/19 0520 02/11/19 0703 02/11/19 1415 02/11/19 1803  HGB 10.9*  --   --   --   --   HCT 33.6*  --   --   --   --   PLT 308  --   --   --   --   HEPARINUNFRC  --   --  0.19*  --  0.14*  CREATININE  --   --   --  0.93  --   TROPONINIHS 1,001* 1,079*  --   --   --     CrCl cannot be calculated (Unknown ideal weight.).   Medical History: Past Medical History:  Diagnosis Date  . Adult acne   . Arthritis    "all over" (02/24/2018)  . Chronic bronchitis ([place])   . GERD (gastroesophageal reflux disease)   . Headache    "at least 1/wk" (02/24/2018)  . Heart murmur    AS A 68 YR OLD...HASN'T BEEN A PROBLEM SINCE THEN.   . High cholesterol   . Hypertension   . Hypothyroidism     Medications:  . heparin 1,050 Units/hr (02/11/19 1600)     Assessment: 68 yo female admitted from OSH with chest pain, planning cath lab tomorrow.    Heparin level this evening remains subtherapeutic at 0.14, on 1050 units/hr. Hgb 10.9, plt 308. Trop up to 1079. No s/sx of bleeding or infusion issues.   Goal of Therapy:  Heparin level 0.3-0.7 units/ml Monitor platelets by anticoagulation protocol: Yes   Plan:  Increase IV heparin to 1200 units/hr Recheck heparin level in 8 hrs. Daily heparin level and  CBC.  Antonietta Jewel, PharmD, BCCCP Clinical Pharmacist  Phone: [phone]  Please check AMION for all [facility] phone numbers After 10:00 PM, call [place] [phone]  02/11/2019 8:20 PM

## 2019-02-11 NOTE — Progress Notes (Signed)
ANTICOAGULATION CONSULT NOTE - Initial Consult  Pharmacy Consult for heparin Indication: chest pain/ACS  Allergies  Allergen Reactions  . Celebrex [Celecoxib] Swelling  . Mobic [Meloxicam] Swelling  . Oxycodone Hcl Itching    Patient Measurements:    Vital Signs:    Labs: No results for input(s): HGB, HCT, PLT, APTT, LABPROT, INR, HEPARINUNFRC, HEPRLOWMOCWT, CREATININE, CKTOTAL, CKMB, TROPONINIHS in the last 72 hours.  CrCl cannot be calculated (Patient's most recent lab result is older than the maximum 21 days allowed.).   Medical History: Past Medical History:  Diagnosis Date  . Adult acne   . Arthritis    "all over" (02/24/2018)  . Chronic bronchitis ([place])   . GERD (gastroesophageal reflux disease)   . Headache    "at least 1/wk" (02/24/2018)  . Heart murmur    AS A 68 YR OLD...HASN'T BEEN A PROBLEM SINCE THEN.   . High cholesterol   . Hypertension   . Hypothyroidism     Medications:  Per RN received heparin 4000 unit bolus and drip at 900 units/hr at OSH at 23:15  Assessment: 68 yo lady to continue heparin for CP Goal of Therapy:  Heparin level 0.3-0.7 units/ml Monitor platelets by anticoagulation protocol: Yes   Plan:  Cont heparin at 900 units/hr Check heparin level later this am Daily HL and CBC while on heparin Monitor for bleeding complications   Megan Aguilar 02/11/2019,2:55 AM

## 2019-02-11 NOTE — H&P (Signed)
CARDIOLOGY ADMIT NOTE   Patient ID: Megan Aguilar MRN: [id number] DOB/AGE: [birth year] 68 y.o.  Admit date: 02/11/2019 Primary Physician:  Blane Ohara, MD  Patient ID: Megan Aguilar, female    DOB: 1950/03/22, 68 y.o.   MRN: [id number]  CC: Chest pain HPI:    Megan Aguilar  is a 68 y.o. Caucasian female with history of hypertension, hyperlipidemia, new onset diabetes mellitus, who had been doing well until yesterday afternoon started having chest tightness.  She took 4 baby aspirin's and still continued to have chest discomfort with radiation to the left shoulder.  She presented to [facility] with positive cardiac markers and also EKG abnormalities in the inferior wall and lateral wall with ST depression.  Upon presentation to the emergency room she received sublingual nitroglycerin with relief of chest pain and no recurrence.  Due to abnormal EKG and also positive cardiac markers she was transferred to [facility] for further management and evaluation.  This morning she is feeling well, has had occasional very mild chest discomfort but has resolved spontaneously.  No associated nausea, vomiting, diaphoresis or dyspnea.  Past Medical History:  Diagnosis Date  . Adult acne   . Arthritis    "all over" (02/24/2018)  . Chronic bronchitis (HCC)   . GERD (gastroesophageal reflux disease)   . Headache    "at least 1/wk" (02/24/2018)  . Heart murmur    AS A 68 YR OLD...HASN'T BEEN A PROBLEM SINCE THEN.   . High cholesterol   . Hypertension   . Hypothyroidism    Past Surgical History:  Procedure Laterality Date  . ANTERIOR / POSTERIOR COMBINED FUSION CERVICAL SPINE  02/24/2018   C3-4; C7-T1  . ANTERIOR CERVICAL DECOMP/DISCECTOMY FUSION  2006  . ANTERIOR CERVICAL DECOMP/DISCECTOMY FUSION N/A 02/24/2018   Procedure: Anterior Cervical Decompression Fusion  - Cervical three-Cervical four;  Surgeon: Tia Alert, MD;  Location: [facility] OR;  Service: Neurosurgery;  Laterality:  N/A;  . BACK SURGERY    . JOINT REPLACEMENT    . LAPAROSCOPIC CHOLECYSTECTOMY    . POSTERIOR CERVICAL FUSION/FORAMINOTOMY N/A 02/24/2018   Procedure: Posterior cervical decompression and lateral mass fusion Cervical seven -Thoracic one;  Surgeon: Tia Alert, MD;  Location: [facility] OR;  Service: Neurosurgery;  Laterality: N/A;  . TOTAL ABDOMINAL HYSTERECTOMY  2017  . TOTAL HIP ARTHROPLASTY Right 07/2017  . TUBAL LIGATION  1977   Social History   Socioeconomic History  . Marital status: Married    Spouse name: Not on file  . Number of children: Not on file  . Years of education: Not on file  . Highest education level: Not on file  Occupational History  . Not on file  Tobacco Use  . Smoking status: Former Smoker    Packs/day: 2.00    Years: 30.00    Pack years: 60.00    Types: Cigarettes    Quit date: 02/21/2004    Years since quitting: 14.9  . Smokeless tobacco: Never Used  Substance and Sexual Activity  . Alcohol use: Never  . Drug use: Never  . Sexual activity: Not Currently  Other Topics Concern  . Not on file  Social History Narrative  . Not on file   Social Determinants of Health   Financial Resource Strain:   . Difficulty of Paying Living Expenses: Not on file  Food Insecurity:   . Worried About Programme researcher, broadcasting/film/video in the Last Year: Not on file  . Ran Out of Food  in the Last Year: Not on file  Transportation Needs:   . Lack of Transportation (Medical): Not on file  . Lack of Transportation (Non-Medical): Not on file  Physical Activity:   . Days of Exercise per Week: Not on file  . Minutes of Exercise per Session: Not on file  Stress:   . Feeling of Stress : Not on file  Social Connections:   . Frequency of Communication with Friends and Family: Not on file  . Frequency of Social Gatherings with Friends and Family: Not on file  . Attends Religious Services: Not on file  . Active Member of Clubs or Organizations: Not on file  . Attends BankerClub or Organization  Meetings: Not on file  . Marital Status: Not on file  Intimate Partner Violence:   . Fear of Current or Ex-Partner: Not on file  . Emotionally Abused: Not on file  . Physically Abused: Not on file  . Sexually Abused: Not on file   ROS  Review of Systems  Constitution: Negative for chills, decreased appetite, malaise/fatigue and weight gain.  Cardiovascular: Positive for chest pain. Negative for dyspnea on exertion, leg swelling and syncope.  Endocrine: Negative for cold intolerance.  Hematologic/Lymphatic: Does not bruise/bleed easily.  Musculoskeletal: Positive for joint pain. Negative for joint swelling.  Gastrointestinal: Negative for abdominal pain, anorexia, change in bowel habit, hematochezia and melena.  Neurological: Negative for headaches and light-headedness.  Psychiatric/Behavioral: Negative for depression and substance abuse.  All other systems reviewed and are negative.  Objective   Vitals with BMI 02/11/2019 02/11/2019 02/25/2018  Height - - -  Weight - - -  BMI - - -  Systolic 120 114 [id number]  Diastolic 71 63 65  Pulse 58 59 81      Physical Exam  Constitutional:  She is moderately built and mildly obese in no acute distress  HENT:  Head: Atraumatic.  Eyes: Conjunctivae are normal.  Neck: No JVD present. No thyromegaly present.  Cardiovascular: Normal rate, regular rhythm, normal heart sounds and intact distal pulses. Exam reveals no gallop.  No murmur heard. Pulses:      Carotid pulses are on the right side with bruit and on the left side with bruit. No leg edema, no JVD.  Pulmonary/Chest: Effort normal and breath sounds normal.  Abdominal: Soft. Bowel sounds are normal.  Musculoskeletal:        General: Normal range of motion.     Cervical back: Neck supple.  Neurological: She is alert.  Skin: Skin is warm and dry.  Psychiatric: She has a normal mood and affect.   Laboratory examination:   Recent Labs    02/20/18 1122  NA 136  K 3.9  CL 103  CO2  21*  GLUCOSE 104*  BUN 11  CREATININE 0.89  CALCIUM 9.8  GFRNONAA >60  GFRAA >60   CrCl cannot be calculated (Patient's most recent lab result is older than the maximum 21 days allowed.).  CMP Latest Ref Rng & Units 02/20/2018  Glucose 70 - 99 mg/dL 096(E104(H)  BUN 8 - 23 mg/dL 11  Creatinine 4.540.44 - 0.981.00 mg/dL 1.190.89  Sodium 147135 - 829145 mmol/L 136  Potassium 3.5 - 5.1 mmol/L 3.9  Chloride 98 - 111 mmol/L 103  CO2 22 - 32 mmol/L 21(L)  Calcium 8.9 - 10.3 mg/dL 9.8   CBC Latest Ref Rng & Units 02/11/2019 02/20/2018  WBC 4.0 - 10.5 K/uL 8.0 6.7  Hemoglobin 12.0 - 15.0 g/dL 10.9(L) 12.8  Hematocrit 36.0 -  46.0 % 33.6(L) 40.5  Platelets 150 - 400 K/uL 308 343   Lipid Panel     Component Value Date/Time   CHOL 178 02/11/2019 0257   TRIG 103 02/11/2019 0257   HDL 67 02/11/2019 0257   CHOLHDL 2.7 02/11/2019 0257   VLDL 21 02/11/2019 0257   LDLCALC 90 02/11/2019 0257   HEMOGLOBIN A1C Lab Results  Component Value Date   HGBA1C 6.0 (H) 02/11/2019   MPG 125.5 02/11/2019   TSH Recent Labs    02/11/19 0257  TSH 2.832   BNP (last 3 results) No results for input(s): BNP in the last 8760 hours.    Ref Range & Units 05:20 02:57  Troponin I (High Sensitivity) <18 ng/L 1,079High Panic   1,001High Panic  CM   Comment: CRITICAL VALUE NOTED. VALUE IS CONSISTENT WITH PREVIOUSLY REPORTED AND CALLED VALUE.        Medications and allergies   Allergies  Allergen Reactions  . Celebrex [Celecoxib] Swelling  . Mobic [Meloxicam] Swelling  . Oxycodone Hcl Itching     . heparin 1,050 Units/hr (02/11/19 1018)    Current Outpatient Medications  Medication Instructions  . B Complex-C (B-COMPLEX WITH VITAMIN C) tablet 1 tablet, Oral, Daily  . Calcium Carb-Cholecalciferol (CALCIUM 600+D) 600-800 MG-UNIT TABS 1 tablet, Oral, Daily  . Co Q-10 200 mg, Oral, Daily  . Cyanocobalamin (VITAMIN B-12 PO) 5,000 mcg, Oral, Daily  . doxycycline (PERIOSTAT) 20 mg, Oral, 2 times daily  . esomeprazole  (NEXIUM) 40 mg, Oral, Daily  . Fish Oil 1,200 mg, Oral, Daily  . fluticasone (FLONASE) 50 MCG/ACT nasal spray 2 sprays, Each Nare, Daily PRN  . gabapentin (NEURONTIN) 300 mg, Oral, 2 times daily  . Glucosamine-Chondroit-Vit C-Mn (GLUCOSAMINE 1500 COMPLEX PO) 3,000 mg, Oral, Daily  . hydrochlorothiazide (HYDRODIURIL) 25 mg, Oral, Daily  . HYDROcodone-acetaminophen (NORCO/VICODIN) 5-325 MG tablet 1 tablet, Oral, Every 4 hours PRN  . levothyroxine (SYNTHROID) 50 mcg, Oral, Daily before breakfast  . lisinopril (ZESTRIL) 10 mg, Oral, Daily  . magnesium oxide (MAG-OX) 800 mg, Oral, Daily  . methylPREDNISolone (MEDROL) 4 MG TBPK tablet Take according to package insert  . Red Yeast Rice 1,200 mg, Oral, Daily  . tiZANidine (ZANAFLEX) 4 mg, Oral, 3 times daily PRN  . TURMERIC PO 538 mg, Oral, Daily   No current facility-administered medications on file prior to encounter.   Current Outpatient Medications on File Prior to Encounter  Medication Sig Dispense Refill  . B Complex-C (B-COMPLEX WITH VITAMIN C) tablet Take 1 tablet by mouth daily.    . Calcium Carb-Cholecalciferol (CALCIUM 600+D) 600-800 MG-UNIT TABS Take 1 tablet by mouth daily.    . Coenzyme Q10 (CO Q-10) 200 MG CAPS Take 200 mg by mouth daily.    . Cyanocobalamin (VITAMIN B-12 PO) Take 5,000 mcg by mouth daily.    Marland Kitchen doxycycline (PERIOSTAT) 20 MG tablet Take 20 mg by mouth 2 (two) times daily.    Marland Kitchen esomeprazole (NEXIUM) 40 MG capsule Take 40 mg by mouth daily at 12 noon.    . fluticasone (FLONASE) 50 MCG/ACT nasal spray Place 2 sprays into both nostrils daily as needed for allergies or rhinitis.    Marland Kitchen gabapentin (NEURONTIN) 300 MG capsule Take 300 mg by mouth 2 (two) times daily.    . Glucosamine-Chondroit-Vit C-Mn (GLUCOSAMINE 1500 COMPLEX PO) Take 3,000 mg by mouth daily.    . hydrochlorothiazide (HYDRODIURIL) 25 MG tablet Take 25 mg by mouth daily.    Marland Kitchen HYDROcodone-acetaminophen (NORCO/VICODIN) 5-325 MG  tablet Take 1 tablet by mouth  every 4 (four) hours as needed for moderate pain or severe pain. 60 tablet 0  . levothyroxine (SYNTHROID, LEVOTHROID) 50 MCG tablet Take 50 mcg by mouth daily before breakfast.    . lisinopril (PRINIVIL,ZESTRIL) 10 MG tablet Take 10 mg by mouth daily.    . magnesium oxide (MAG-OX) 400 MG tablet Take 800 mg by mouth daily.    . methylPREDNISolone (MEDROL) 4 MG TBPK tablet Take according to package insert 21 tablet 0  . Omega-3 Fatty Acids (FISH OIL) 1200 MG CAPS Take 1,200 mg by mouth daily.    . Red Yeast Rice 600 MG CAPS Take 1,200 mg by mouth daily.    Marland Kitchen tiZANidine (ZANAFLEX) 4 MG tablet Take 4 mg by mouth 3 (three) times daily as needed for muscle spasms.    . TURMERIC PO Take 538 mg by mouth daily.      No intake/output data recorded. Total I/O In: 421.1 [P.O.:360; I.V.:61.1] Out: -    Radiology:   Chest x-ray at [facility] 02/10/2019: Normal.  CT angiogram chest 02/10/2019: No PE, normal. Cardiac Studies:   None  Assessment   1.  NSTEMI inferolateral wall 2.  Hypertension 3.  Diabetes mellitus type 2 controlled without hypoglycemia 4.  Hyperlipidemia, patient previously on red yeast rice at home  EKG 02/11/2019: Normal sinus rhythm without ischemia.  Compared to 02/10/2019, inferolateral horizontal ST depression no longer present.  Recommendations:   Patient is presently chest pain-free.  She is presently on IV heparin, continue the same.  She will need cardiac catheterization for definitive delineation of coronary anatomy.  I have started her on low-dose beta-blocker, high intensity statin, blood pressure is well controlled, cardiac catheterization will be scheduled in the morning. Discussed risks, benefits and alternatives of angiogram including but not limited to <1% risk of death, stroke, MI, need for urgent surgical revascularization, renal failure, but not limited to thest. patient is willing to proceed.   Yates Decamp, MD, [facility] 02/11/2019, 11:29 AM [facility] Pager: [phone] Office: [phone]

## 2019-02-12 ENCOUNTER — Inpatient Hospital Stay (HOSPITAL_COMMUNITY)
Admission: AD | Disposition: A | Discharge status: Home / Self Care | Payer: Self-pay | Source: Other Acute Inpatient Hospital | Attending: Cardiology

## 2019-02-12 ENCOUNTER — Other Ambulatory Visit (HOSPITAL_COMMUNITY): Payer: Medicare Other

## 2019-02-12 ENCOUNTER — Other Ambulatory Visit: Payer: Self-pay

## 2019-02-12 ENCOUNTER — Encounter (HOSPITAL_COMMUNITY): Payer: Self-pay | Admitting: Cardiology

## 2019-02-12 HISTORY — PX: LEFT HEART CATH AND CORONARY ANGIOGRAPHY: CATH118249

## 2019-02-12 LAB — HEPARIN LEVEL (UNFRACTIONATED): Heparin Unfractionated: 0.28 [IU]/mL — ABNORMAL LOW (ref 0.30–0.70)

## 2019-02-12 LAB — CBC
HCT: 33.9 % — ABNORMAL LOW (ref 36.0–46.0)
Hemoglobin: 10.7 g/dL — ABNORMAL LOW (ref 12.0–15.0)
MCH: 25.8 pg — ABNORMAL LOW (ref 26.0–34.0)
MCHC: 31.6 g/dL (ref 30.0–36.0)
MCV: 81.9 fL (ref 80.0–100.0)
Platelets: 301 K/uL (ref 150–400)
RBC: 4.14 MIL/uL (ref 3.87–5.11)
RDW: 15.2 % (ref 11.5–15.5)
WBC: 9.4 K/uL (ref 4.0–10.5)
nRBC: 0 % (ref 0.0–0.2)

## 2019-02-12 LAB — GLUCOSE, CAPILLARY: Glucose-Capillary: 118 mg/dL — ABNORMAL HIGH (ref 70–99)

## 2019-02-12 SURGERY — LEFT HEART CATH AND CORONARY ANGIOGRAPHY
Anesthesia: LOCAL

## 2019-02-12 MED ORDER — LIDOCAINE HCL (PF) 1 % IJ SOLN
INTRAMUSCULAR | Status: AC
  Filled 2019-02-12: qty 30

## 2019-02-12 MED ORDER — NITROGLYCERIN 0.4 MG SL SUBL: 0.4000 mg | SUBLINGUAL_TABLET | SUBLINGUAL | 1 refills | Status: AC | PRN

## 2019-02-12 MED ORDER — FENTANYL CITRATE (PF) 100 MCG/2ML IJ SOLN
INTRAMUSCULAR | Status: AC
  Filled 2019-02-12: qty 2

## 2019-02-12 MED ORDER — HEPARIN (PORCINE) IN NACL 1000-0.9 UT/500ML-% IV SOLN
INTRAVENOUS | Status: AC
  Filled 2019-02-12: qty 1000

## 2019-02-12 MED ORDER — NITROGLYCERIN 1 MG/10 ML FOR IR/CATH LAB
INTRA_ARTERIAL | Status: DC | PRN
  Administered 2019-02-12: 200 ug via INTRACORONARY

## 2019-02-12 MED ORDER — ATORVASTATIN CALCIUM 80 MG PO TABS: 80.0000 mg | ORAL_TABLET | Freq: Every day | ORAL | 1 refills | Status: DC

## 2019-02-12 MED ORDER — SODIUM CHLORIDE 0.9 % WEIGHT BASED INFUSION: 1.0000 mL/kg/h | INTRAVENOUS | Status: DC

## 2019-02-12 MED ORDER — SODIUM CHLORIDE 0.9% FLUSH: 3.0000 mL | INTRAVENOUS | Status: DC | PRN

## 2019-02-12 MED ORDER — IOHEXOL 350 MG/ML SOLN
INTRAVENOUS | Status: DC | PRN
  Administered 2019-02-12: 70 mL

## 2019-02-12 MED ORDER — HEPARIN SODIUM (PORCINE) 1000 UNIT/ML IJ SOLN
INTRAMUSCULAR | Status: DC | PRN
  Administered 2019-02-12: 5000 [IU] via INTRAVENOUS

## 2019-02-12 MED ORDER — VERAPAMIL HCL 2.5 MG/ML IV SOLN
INTRAVENOUS | Status: AC
  Filled 2019-02-12: qty 2

## 2019-02-12 MED ORDER — MIDAZOLAM HCL 2 MG/2ML IJ SOLN
INTRAMUSCULAR | Status: AC
  Filled 2019-02-12: qty 2

## 2019-02-12 MED ORDER — MIDAZOLAM HCL 2 MG/2ML IJ SOLN
INTRAMUSCULAR | Status: DC | PRN
  Administered 2019-02-12: 1 mg via INTRAVENOUS

## 2019-02-12 MED ORDER — LIDOCAINE HCL (PF) 1 % IJ SOLN
INTRAMUSCULAR | Status: DC | PRN
  Administered 2019-02-12: 2 mL

## 2019-02-12 MED ORDER — CLOPIDOGREL BISULFATE 75 MG PO TABS: 75.0000 mg | ORAL_TABLET | Freq: Every day | ORAL | 2 refills | Status: DC

## 2019-02-12 MED ORDER — VERAPAMIL HCL 2.5 MG/ML IV SOLN
INTRA_ARTERIAL | Status: DC | PRN
  Administered 2019-02-12: 5 mL via INTRA_ARTERIAL

## 2019-02-12 MED ORDER — NITROGLYCERIN 1 MG/10 ML FOR IR/CATH LAB
INTRA_ARTERIAL | Status: AC
  Filled 2019-02-12: qty 10

## 2019-02-12 MED ORDER — SODIUM CHLORIDE 0.9 % IV SOLN: 250.0000 mL | INTRAVENOUS | Status: DC | PRN

## 2019-02-12 MED ORDER — METFORMIN HCL 500 MG PO TABS: 500.0000 mg | ORAL_TABLET | Freq: Every day | ORAL | Status: DC

## 2019-02-12 MED ORDER — FENTANYL CITRATE (PF) 100 MCG/2ML IJ SOLN
INTRAMUSCULAR | Status: DC | PRN
  Administered 2019-02-12: 25 ug via INTRAVENOUS
  Administered 2019-02-12: 50 ug via INTRAVENOUS

## 2019-02-12 MED ORDER — AMLODIPINE BESYLATE 5 MG PO TABS: 5.0000 mg | ORAL_TABLET | Freq: Every day | ORAL | 2 refills | Status: DC

## 2019-02-12 MED ORDER — SODIUM CHLORIDE 0.9% FLUSH: 3.0000 mL | Freq: Two times a day (BID) | INTRAVENOUS | Status: DC

## 2019-02-12 SURGICAL SUPPLY — 14 items
CATH OPTITORQUE TIG 4.0 5F (CATHETERS) ×2 IMPLANT
DEVICE RAD COMP TR BAND LRG (VASCULAR PRODUCTS) ×2 IMPLANT
GLIDESHEATH SLEND A-KIT 6F 22G (SHEATH) ×2 IMPLANT
GUIDEWIRE ANGLED .035X150CM (WIRE) ×2 IMPLANT
GUIDEWIRE INQWIRE 1.5J.035X260 (WIRE) ×1 IMPLANT
INQWIRE 1.5J .035X260CM (WIRE) ×2
KIT HEART LEFT (KITS) ×2 IMPLANT
KIT MICROPUNCTURE NIT STIFF (SHEATH) IMPLANT
PACK CARDIAC CATHETERIZATION (CUSTOM PROCEDURE TRAY) ×2 IMPLANT
SHEATH PINNACLE 5F 10CM (SHEATH) ×2 IMPLANT
SHEATH PROBE COVER 6X72 (BAG) ×2 IMPLANT
TRANSDUCER W/STOPCOCK (MISCELLANEOUS) ×2 IMPLANT
TUBING CIL FLEX 10 FLL-RA (TUBING) ×2 IMPLANT
WIRE EMERALD 3MM-J .035X150CM (WIRE) IMPLANT

## 2019-02-12 NOTE — Progress Notes (Signed)
Discussed with her husband and updated the status. He was appreciative. JG

## 2019-02-12 NOTE — Discharge Instructions (Signed)
Radial Site Care ° °This sheet gives you information about how to care for yourself after your procedure. Your health care provider may also give you more specific instructions. If you have problems or questions, contact your health care provider. °What can I expect after the procedure? °After the procedure, it is common to have: °· Bruising and tenderness at the catheter insertion area. °Follow these instructions at home: °Medicines °· Take over-the-counter and prescription medicines only as told by your health care provider. °Insertion site care °· Follow instructions from your health care provider about how to take care of your insertion site. Make sure you: °? Wash your hands with soap and water before you change your bandage (dressing). If soap and water are not available, use hand sanitizer. °? Change your dressing as told by your health care provider. °? Leave stitches (sutures), skin glue, or adhesive strips in place. These skin closures may need to stay in place for 2 weeks or longer. If adhesive strip edges start to loosen and curl up, you may trim the loose edges. Do not remove adhesive strips completely unless your health care provider tells you to do that. °· Check your insertion site every day for signs of infection. Check for: °? Redness, swelling, or pain. °? Fluid or blood. °? Pus or a bad smell. °? Warmth. °· Do not take baths, swim, or use a hot tub until your health care provider approves. °· You may shower 24-48 hours after the procedure, or as directed by your health care provider. °? Remove the dressing and gently wash the site with plain soap and water. °? Pat the area dry with a clean towel. °? Do not rub the site. That could cause bleeding. °· Do not apply powder or lotion to the site. °Activity ° °· For 24 hours after the procedure, or as directed by your health care provider: °? Do not flex or bend the affected arm. °? Do not push or pull heavy objects with the affected arm. °? Do not  drive yourself home from the hospital or clinic. You may drive 24 hours after the procedure unless your health care provider tells you not to. °? Do not operate machinery or power tools. °· Do not lift anything that is heavier than 10 lb (4.5 kg), or the limit that you are told, until your health care provider says that it is safe. °· Ask your health care provider when it is okay to: °? Return to work or school. °? Resume usual physical activities or sports. °? Resume sexual activity. °General instructions °· If the catheter site starts to bleed, raise your arm and put firm pressure on the site. If the bleeding does not stop, get help right away. This is a medical emergency. °· If you went home on the same day as your procedure, a responsible adult should be with you for the first 24 hours after you arrive home. °· Keep all follow-up visits as told by your health care provider. This is important. °Contact a health care provider if: °· You have a fever. °· You have redness, swelling, or yellow drainage around your insertion site. °Get help right away if: °· You have unusual pain at the radial site. °· The catheter insertion area swells very fast. °· The insertion area is bleeding, and the bleeding does not stop when you hold steady pressure on the area. °· Your arm or hand becomes pale, cool, tingly, or numb. °These symptoms may represent a serious problem   that is an emergency. Do not wait to see if the symptoms will go away. Get medical help right away. Call your local emergency services (911 in the [place] Do not drive yourself to the hospital. °Summary °· After the procedure, it is common to have bruising and tenderness at the site. °· Follow instructions from your health care provider about how to take care of your radial site wound. Check the wound every day for signs of infection. °· Do not lift anything that is heavier than 10 lb (4.5 kg), or the limit that you are told, until your health care provider says  that it is safe. °This information is not intended to replace advice given to you by your health care provider. Make sure you discuss any questions you have with your health care provider. °Document Released: 03/06/2010 Document Revised: 03/09/2017 Document Reviewed: 03/09/2017 °Elsevier Patient Education © 2020 [employer] ° °

## 2019-02-12 NOTE — Progress Notes (Signed)
[place] for heparin Indication: chest pain/ACS  Allergies  Allergen Reactions  . Banana Other (See Comments)    Upset stomach   . Cantaloupe (Diagnostic) Other (See Comments)    Upset stomach   . Celebrex [Celecoxib] Swelling  . Egg [Eggs Or Egg-Derived Products] Other (See Comments)    Upset stomach   . Mobic [Meloxicam] Swelling  . Oxycodone Hcl Itching  . Pineapple Other (See Comments)    Upset stomach    Patient Measurements: Weight: 203 lb 0.7 oz (92.1 kg)  Vital Signs: Temp: 97.6 F (36.4 C) (12/28 0324) Temp Source: Oral (12/28 0324) BP: 132/79 (12/28 0324) Pulse Rate: 56 (12/28 0324)  Labs: Recent Labs    02/11/19 0257 02/11/19 0520 02/11/19 0703 02/11/19 1415 02/11/19 1803 02/12/19 0243  HGB 10.9*  --   --   --   --  10.7*  HCT 33.6*  --   --   --   --  33.9*  PLT 308  --   --   --   --  301  HEPARINUNFRC  --   --  0.19*  --  0.14* 0.28*  CREATININE  --   --   --  0.93  --   --   TROPONINIHS 1,001* 1,079*  --   --   --   --     CrCl cannot be calculated (Unknown ideal weight.).  Assessment: 68 yo female admitted from OSH with chest pain, planning cath lab tomorrow.    Heparin level remains slightly subtherapeutic(0.28) on 1050 units/hr. No s/sx of bleeding or infusion issues.   Goal of Therapy:  Heparin level 0.3-0.7 units/ml Monitor platelets by anticoagulation protocol: Yes   Plan:  Increase IV heparin to 1350 units/hr Recheck heparin level in 8 hrs.  Sherlon Handing, PharmD, BCPS Please see amion for complete clinical pharmacist phone list  02/12/2019 3:39 AM

## 2019-02-12 NOTE — Progress Notes (Signed)
MD notified of low HR 38-50 sinus brady. BP 105/74 and asymptomatic. No orders received. Patient prepared to go to cath this morning.

## 2019-02-12 NOTE — Progress Notes (Signed)
Wrote Phase I CR order as pt is MI. Discussed MI, restrictions, diet, exercise, NTG, Plavix, and CRPII. Good reception. Will refer to [facility]. [phone] [place], ACSM 11:40 AM 02/12/2019

## 2019-02-12 NOTE — Discharge Summary (Addendum)
Physician Discharge Summary  Patient ID: Megan Aguilar MRN: [id number] DOB/AGE: Jun 08, 1950 68 y.o.  Admit date: 02/11/2019 Discharge date: 02/12/2019  Primary Discharge Diagnosis NSTEMI CAD native vessel with USA/NSTEMI Asymptomatic sinus bradycardia  Secondary Discharge Diagnosis DM controlled without complications without hyperglycemia Hypertension essential Hyperlipidemia.   Significant Diagnostic Studies: Left Heart Catheterization 02/12/19:   Mid LAD lesion is 50% stenosed. Prox LAD lesion is 30% stenosed. Distal LAD 30%. Normal moderate sized Cx and large dominant RCA.  The left ventricular systolic function is normal. LV end diastolic pressure is normal.  Recommendation: Patient will be discharged home today, medical therapy for moderate coronary artery disease. Suspect she had an ulcerated proximal LAD and a mid to distal LAD stenosis however the stenosis now appears to be much more stable. Aspirin indefinitely and Plavix for 3 to 6 months and start statins high-dose high intensity. Due to bradycardia, metoprolol was not started, will be started on amlodipine and will discontinue Benicar for now and also hydrochlorothiazide for now as blood pressure has been soft. This can be adjusted in the outpatient basis.  Hospital Course: Patient presented with chest pain to [facility] and had cardiac markers that were positive along with EKG abnormalities in the inferolateral leads with ST segment depression.  She had transferred to [facility], upon arrival she was chest pain-free and normalization of the EKG abnormality.  She did have positive high sensitive serum troponin suggestive of non-STEMI as well.  In view of her risk factors including hypertension, hyperlipidemia, diabetes mellitus, was scheduled for cardiac catheterization which she underwent without any complications and was found to have moderate coronary disease, suspect over the past 48 hours, patient has  stabilized with regard to unstable plaque in the mid LAD and also distal LAD, recommended medical therapy.  In view of COVID-19, also due to the fact that she showed significant and rapid improvement in clinical status, I felt it was safe to discharge her after the cardiac catheterization.  All questions answered.  Recommendations on discharge:  Discharge home today with high intensity high-dose statins and also dual antiplatelet therapy.  Hydrochlorothiazide and Benicar (5 mg) low-dose was discontinued, patient was started on amlodipine for possible coronary spasm on top of moderate coronary disease which I suspect had healed over 2 days of being on heparin.  Outpatient follow-up will be performed.  She will need continued risk factor modification.  Beta-blocker therapy was not started due to underlying asymptomatic sinus bradycardia and borderline low blood pressure.  Discharge Exam: Blood pressure 107/60, pulse 55  temperature 97.9 F (36.6 C), temperature source Oral, resp. rate 12, height 5\' 3"  (1.6 m), weight 92.6 kg, SpO2 100 %.  Physical Exam  Constitutional:  Moderately built and mildly obese in no acute distress.  HENT:  Head: Atraumatic.  Eyes: Conjunctivae are normal.  Neck: No JVD present. No thyromegaly present.  Cardiovascular: Normal rate, regular rhythm, normal heart sounds and intact distal pulses. Exam reveals no gallop.  No murmur heard. No leg edema, no JVD.  Pulmonary/Chest: Effort normal and breath sounds normal.  Abdominal: Soft. Bowel sounds are normal.  Musculoskeletal:        General: Normal range of motion.     Cervical back: Neck supple.  Neurological: She is alert.  Skin: Skin is warm and dry.  Psychiatric: She has a normal mood and affect.     Labs:   Lab Results  Component Value Date   WBC 9.4 02/12/2019   HGB 10.7 (L)  02/12/2019   HCT 33.9 (L) 02/12/2019   MCV 81.9 02/12/2019   PLT 301 02/12/2019    Recent Labs  Lab 02/11/19 1415  NA 138  K  4.2  CL 104  CO2 24  BUN 13  CREATININE 0.93  CALCIUM 9.5  GLUCOSE 119*   Lipid Panel     Component Value Date/Time   CHOL 178 02/11/2019 0257   TRIG 103 02/11/2019 0257   HDL 67 02/11/2019 0257   CHOLHDL 2.7 02/11/2019 0257   VLDL 21 02/11/2019 0257   LDLCALC 90 02/11/2019 0257    BNP (last 3 results) No results for input(s): BNP in the last 8760 hours.  HEMOGLOBIN A1C Lab Results  Component Value Date   HGBA1C 6.0 (H) 02/11/2019   MPG 125.5 02/11/2019    Cardiac Panel (last 3 results) No results for input(s): CKTOTAL, CKMB, TROPONINI, RELINDX in the last 8760 hours.  No results found for: CKTOTAL, CKMB, CKMBINDEX, TROPONINI   TSH Recent Labs    02/11/19 0257  TSH 2.832   Radiology: CARDIAC CATHETERIZATION  Result Date: 02/12/2019 Left Heart Catheterization 02/12/19:  Mid LAD lesion is 50% stenosed. Prox LAD lesion is 30% stenosed. Distal LAD 30%. Normal moderate sized Cx and large dominant RCA.  The left ventricular systolic function is normal. LV end diastolic pressure is normal.  Recommendation: Patient will be discharged home today, medical therapy for moderate coronary artery disease. Suspect she had an ulcerated proximal LAD and a mid to distal LAD stenosis however the stenosis now appears to be much more stable. Aspirin indefinitely and Plavix for 3 to 6 months and start statins high-dose high intensity. Due to bradycardia, metoprolol was not started, will be started on amlodipine and will discontinue Benicar for now and also hydrochlorothiazide for now as blood pressure has been soft. This can be adjusted in the outpatient basis.  FOLLOW UP PLANS AND APPOINTMENTS  Allergies as of 02/12/2019      Reactions   Banana Other (See Comments)   Upset stomach    Cantaloupe (diagnostic) Other (See Comments)   Upset stomach    Celebrex [celecoxib] Swelling   Egg [eggs Or Egg-derived Products] Other (See Comments)   Upset stomach    Mobic [meloxicam] Swelling    Oxycodone Hcl Itching   Pineapple Other (See Comments)   Upset stomach      Medication List    STOP taking these medications   hydrochlorothiazide 25 MG tablet Commonly known as: HYDRODIURIL   olmesartan 5 MG tablet Commonly known as: BENICAR   Red Yeast Rice 600 MG Caps     TAKE these medications   amLODipine 5 MG tablet Commonly known as: NORVASC Take 1 tablet (5 mg total) by mouth daily.   aspirin 81 MG EC tablet Take 81 mg by mouth daily.   atorvastatin 80 MG tablet Commonly known as: LIPITOR Take 1 tablet (80 mg total) by mouth daily at 6 PM.   B-complex with vitamin C tablet Take 1 tablet by mouth daily.   Calcium 600+D 600-800 MG-UNIT Tabs Generic drug: Calcium Carb-Cholecalciferol Take 1 tablet by mouth daily.   clopidogrel 75 MG tablet Commonly known as: Plavix Take 1 tablet (75 mg total) by mouth daily.   Co Q-10 200 MG Caps Take 200 mg by mouth daily.   fexofenadine 180 MG tablet Commonly known as: ALLEGRA Take 180 mg by mouth at bedtime.   Fish Oil 1200 MG Caps Take 1,200 mg by mouth daily.   fluticasone 50  MCG/ACT nasal spray Commonly known as: FLONASE Place 2 sprays into both nostrils daily as needed for allergies or rhinitis.   gabapentin 300 MG capsule Commonly known as: NEURONTIN Take 300 mg by mouth daily.   GLUCOSAMINE 1500 COMPLEX PO Take 3,000 mg by mouth daily.   HYDROcodone-acetaminophen 10-325 MG tablet Commonly known as: NORCO Take 1 tablet by mouth every 6 (six) hours as needed. What changed: Another medication with the same name was removed. Continue taking this medication, and follow the directions you see here.   ketoconazole 2 % shampoo Commonly known as: NIZORAL Apply 1 application topically See admin instructions. 2 to 3 times a week   levothyroxine 50 MCG tablet Commonly known as: SYNTHROID Take 50 mcg by mouth daily before breakfast.   magnesium oxide 400 MG tablet Commonly known as: MAG-OX Take 800 mg by  mouth daily.   Melatonin 5 MG Tabs Take 5 mg by mouth at bedtime.   metFORMIN 500 MG tablet Commonly known as: GLUCOPHAGE Take 1 tablet (500 mg total) by mouth daily. Start taking on: February 14, 2019 What changed: These instructions start on February 14, 2019. If you are unsure what to do until then, ask your doctor or other care provider.   nitroGLYCERIN 0.4 MG SL tablet Commonly known as: NITROSTAT Place 1 tablet (0.4 mg total) under the tongue every 5 (five) minutes as needed for chest pain.   nystatin cream Commonly known as: MYCOSTATIN Apply 1 application topically 2 (two) times daily. Mix with triamcinolone and apply to face   RABEprazole 20 MG tablet Commonly known as: ACIPHEX Take 20 mg by mouth daily.   tiZANidine 4 MG tablet Commonly known as: ZANAFLEX Take 4 mg by mouth at bedtime.   triamcinolone cream 0.1 % Commonly known as: KENALOG Apply 1 application topically 2 (two) times daily. Mix with nystatin and apply to face   TURMERIC PO Take 538 mg by mouth daily.   VITAMIN B-12 PO Take 5,000 mcg by mouth daily.      Follow-up Information    Miquel Dunn, NP Follow up on 02/26/2019.   Specialty: Cardiology Why: 10:30 Appointment Contact information: [place] [address] [phone]           [name], MD 02/12/2019, 8:41 AM  Pager: [phone] Office: [phone] If no answer: [phone]

## 2019-02-12 NOTE — Interval H&P Note (Signed)
History and Physical Interval Note:  02/12/2019 6:43 AM  Megan Aguilar  has presented today for surgery, with the diagnosis of ntemi.  The various methods of treatment have been discussed with the patient and family. After consideration of risks, benefits and other options for treatment, the patient has consented to  Procedure(s): LEFT HEART CATH AND CORONARY ANGIOGRAPHY (N/A) as a surgical intervention.  The patient's history has been reviewed, patient examined, no change in status, stable for surgery.  I have reviewed the patient's chart and labs.  Questions were answered to the patient's satisfaction.   Cath Lab Visit (complete for each Cath Lab visit)  Clinical Evaluation Leading to the Procedure:   ACS: Yes.    Non-ACS:    Anginal Classification: CCS IV  Anti-ischemic medical therapy: Minimal Therapy (1 class of medications)  Non-Invasive Test Results: No non-invasive testing performed  Prior CABG: No previous CABG  Megan Aguilar

## 2019-02-12 NOTE — Plan of Care (Signed)

## 2019-02-13 ENCOUNTER — Telehealth: Payer: Self-pay

## 2019-02-13 MED FILL — Heparin Sod (Porcine)-NaCl IV Soln 1000 Unit/500ML-0.9%: INTRAVENOUS | Qty: 1000 | Status: AC

## 2019-02-13 NOTE — Telephone Encounter (Signed)
Location of hospitalization: [facility] Reason for hospitalization: NSTEMI Date of discharge: 02/12/2019 Date of first communication with patient: today Person contacting patient: Cheri Kearns RMA Current symptoms: NONE pt feels fine and is aware of her upcoming appointment  Do you understand why you were in the Hospital: Yes Questions regarding discharge instructions: None Where were you discharged to: Home Medications reviewed: Yes Allergies reviewed: Yes Dietary changes reviewed: Yes. Discussed low fat and low salt diet.  Referals reviewed: NA Activities of Daily Living: Able to with mild limitations Any transportation issues/concerns: None Any patient concerns: None Confirmed importance & date/time of Follow up appt: Yes Confirmed with patient if condition begins to worsen call. Pt was given the office number and encouraged to call back with questions or concerns: Yes

## 2019-02-14 ENCOUNTER — Telehealth (HOSPITAL_COMMUNITY): Payer: Self-pay

## 2019-02-14 NOTE — Telephone Encounter (Signed)
Faxed pt cardiac rehab referral to [facility] cardiac rehab.

## 2019-02-20 DIAGNOSIS — I201 Angina pectoris with documented spasm: Secondary | ICD-10-CM | POA: Diagnosis not present

## 2019-02-20 DIAGNOSIS — I214 Non-ST elevation (NSTEMI) myocardial infarction: Secondary | ICD-10-CM | POA: Diagnosis not present

## 2019-02-20 DIAGNOSIS — I119 Hypertensive heart disease without heart failure: Secondary | ICD-10-CM | POA: Diagnosis not present

## 2019-02-26 ENCOUNTER — Other Ambulatory Visit: Payer: Self-pay

## 2019-02-26 ENCOUNTER — Encounter: Payer: Self-pay | Admitting: Cardiology

## 2019-02-26 ENCOUNTER — Ambulatory Visit (INDEPENDENT_AMBULATORY_CARE_PROVIDER_SITE_OTHER): Payer: Medicare Other | Admitting: Cardiology

## 2019-02-26 VITALS — BP 176/88 | HR 77 | Temp 97.3°F | Ht 63.0 in | Wt 208.9 lb

## 2019-02-26 DIAGNOSIS — R0989 Other specified symptoms and signs involving the circulatory and respiratory systems: Secondary | ICD-10-CM | POA: Diagnosis not present

## 2019-02-26 DIAGNOSIS — I2510 Atherosclerotic heart disease of native coronary artery without angina pectoris: Secondary | ICD-10-CM

## 2019-02-26 DIAGNOSIS — E7800 Pure hypercholesterolemia, unspecified: Secondary | ICD-10-CM | POA: Diagnosis not present

## 2019-02-26 DIAGNOSIS — E119 Type 2 diabetes mellitus without complications: Secondary | ICD-10-CM

## 2019-02-26 DIAGNOSIS — I10 Essential (primary) hypertension: Secondary | ICD-10-CM

## 2019-02-26 MED ORDER — OLMESARTAN MEDOXOMIL 20 MG PO TABS: 20.0000 mg | ORAL_TABLET | Freq: Every day | ORAL | 1 refills | Status: DC

## 2019-02-26 MED ORDER — ROSUVASTATIN CALCIUM 10 MG PO TABS: 10.0000 mg | ORAL_TABLET | Freq: Every day | ORAL | 1 refills | Status: DC

## 2019-02-26 NOTE — Progress Notes (Signed)
Primary Physician:  Blane Ohara, MD   Patient ID: Megan Aguilar, female    DOB: 09-19-1950, 69 y.o.   MRN: [id number]  Subjective:    Chief Complaint  Patient presents with  . Atherosclerosis of native coronary artery of native heart wi  . Hypertension  . Hyperlipidemia  . Transitions Of Care    HPI: Megan Aguilar  is a 69 y.o. female  with hypertension, hyperlipidemia, controlled type 2 diabetes, and moderate CAD by recent coronary angiogram on 02/12/2019. She now presents for hospital follow-up after presenting with NSTEMI.  Medical therapy was recommended for moderate CAD seen on coronary angiogram. NSTEMI felt to be related to potential coronary vasospasm and was started on amlodipine. She was also discharged on high-dose statin along with dual antiplatelet therapy. She will need to continue with Plavix for 3 to 6 months, aspirin indefinitely. She was not started on metoprolol in view of underlying asymptomatic bradycardia.  Patient is overall feeling well since being discharged from hospital, she has had one episode of chest tightness resolved after taking 2 nitroglycerin.  She has noted that her blood pressure has been elevated.  She was started on Imdur by her PCP.  She decided to not start Lipitor, and resume taking red yeast rice.  She reports having heart murmur since age 49. She is unsure of her last echo.   She reports quitting smoking in 2006.   Past Medical History:  Diagnosis Date  . Adult acne   . Arthritis    "all over" (02/24/2018)  . Chronic bronchitis (HCC)   . GERD (gastroesophageal reflux disease)   . Headache    "at least 1/wk" (02/24/2018)  . Heart murmur    AS A 69 YR OLD...HASN'T BEEN A PROBLEM SINCE THEN.   . High cholesterol   . Hypertension   . Hypothyroidism     Past Surgical History:  Procedure Laterality Date  . ANTERIOR / POSTERIOR COMBINED FUSION CERVICAL SPINE  02/24/2018   C3-4; C7-T1  . ANTERIOR CERVICAL DECOMP/DISCECTOMY FUSION   2006  . ANTERIOR CERVICAL DECOMP/DISCECTOMY FUSION N/A 02/24/2018   Procedure: Anterior Cervical Decompression Fusion  - Cervical three-Cervical four;  Surgeon: Tia Alert, MD;  Location: [facility] OR;  Service: Neurosurgery;  Laterality: N/A;  . BACK SURGERY    . JOINT REPLACEMENT    . LAPAROSCOPIC CHOLECYSTECTOMY    . LEFT HEART CATH AND CORONARY ANGIOGRAPHY N/A 02/12/2019   Procedure: LEFT HEART CATH AND CORONARY ANGIOGRAPHY;  Surgeon: Yates Decamp, MD;  Location: [facility] IN[facility]ASIVE CV LAB;  Service: Cardiovascular;  Laterality: N/A;  . POSTERIOR CERVICAL FUSION/FORAMINOTOMY N/A 02/24/2018   Procedure: Posterior cervical decompression and lateral mass fusion Cervical seven -Thoracic one;  Surgeon: Tia Alert, MD;  Location: [facility] OR;  Service: Neurosurgery;  Laterality: N/A;  . TOTAL ABDOMINAL HYSTERECTOMY  2017  . TOTAL HIP ARTHROPLASTY Right 07/2017  . TUBAL LIGATION  1977    Social History   Socioeconomic History  . Marital status: Married    Spouse name: Not on file  . Number of children: 2  . Years of education: Not on file  . Highest education level: Not on file  Occupational History  . Not on file  Tobacco Use  . Smoking status: Former Smoker    Packs/day: 2.00    Years: 30.00    Pack years: 60.00    Types: Cigarettes    Quit date: 02/21/2004    Years since quitting: 15.0  . Smokeless tobacco:  Never Used  Substance and Sexual Activity  . Alcohol use: Never  . Drug use: Never  . Sexual activity: Not Currently  Other Topics Concern  . Not on file  Social History Narrative  . Not on file   Social Determinants of Health   Financial Resource Strain:   . Difficulty of Paying Living Expenses: Not on file  Food Insecurity:   . Worried About Programme researcher, broadcasting/film/videounning Out of Food in the Last Year: Not on file  . Ran Out of Food in the Last Year: Not on file  Transportation Needs:   . Lack of Transportation (Medical): Not on file  . Lack of Transportation (Non-Medical): Not on file  Physical  Activity:   . Days of Exercise per Week: Not on file  . Minutes of Exercise per Session: Not on file  Stress:   . Feeling of Stress : Not on file  Social Connections:   . Frequency of Communication with Friends and Family: Not on file  . Frequency of Social Gatherings with Friends and Family: Not on file  . Attends Religious Services: Not on file  . Active Member of Clubs or Organizations: Not on file  . Attends BankerClub or Organization Meetings: Not on file  . Marital Status: Not on file  Intimate Partner Violence:   . Fear of Current or Ex-Partner: Not on file  . Emotionally Abused: Not on file  . Physically Abused: Not on file  . Sexually Abused: Not on file    Review of Systems  Constitution: Negative for decreased appetite, malaise/fatigue, weight gain and weight loss.  Eyes: Negative for visual disturbance.  Cardiovascular: Negative for chest pain, claudication, dyspnea on exertion, leg swelling, orthopnea, palpitations and syncope.  Respiratory: Negative for hemoptysis and wheezing.   Endocrine: Negative for cold intolerance and heat intolerance.  Hematologic/Lymphatic: Does not bruise/bleed easily.  Skin: Negative for nail changes.  Musculoskeletal: Negative for muscle weakness and myalgias.  Gastrointestinal: Negative for abdominal pain, change in bowel habit, nausea and vomiting.  Neurological: Negative for difficulty with concentration, dizziness, focal weakness and headaches.  Psychiatric/Behavioral: Negative for altered mental status and suicidal ideas.  All other systems reviewed and are negative.     Objective:  Blood pressure (!) 176/88, pulse 77, temperature (!) 97.3 F (36.3 C), height 5\' 3"  (1.6 m), weight 208 lb 14.4 oz (94.8 kg), SpO2 98 %. Body mass index is 37 kg/m.    Physical Exam  Constitutional: She is oriented to person, place, and time. Vital signs are normal. She appears well-developed and well-nourished.  HENT:  Head: Normocephalic and atraumatic.    Cardiovascular: Normal rate, regular rhythm and intact distal pulses.  Murmur heard.  Early systolic murmur is present with a grade of 2/6 at the upper right sternal border. Pulses:      Carotid pulses are on the right side with bruit and on the left side with bruit. Pulmonary/Chest: Effort normal and breath sounds normal. No accessory muscle usage. No respiratory distress.  Abdominal: Soft. Bowel sounds are normal.  Musculoskeletal:        General: Normal range of motion.     Cervical back: Normal range of motion.  Neurological: She is alert and oriented to person, place, and time.  Skin: Skin is warm and dry.  Vitals reviewed.  Radiology: No results found.  Laboratory examination:    CMP Latest Ref Rng & Units 02/11/2019 02/20/2018  Glucose 70 - 99 mg/dL 161(W119(H) 960(A104(H)  BUN 8 - 23 mg/dL 13  11  Creatinine 0.44 - 1.00 mg/dL 9.79 8.92  Sodium 119 - 145 mmol/L 138 136  Potassium 3.5 - 5.1 mmol/L 4.2 3.9  Chloride 98 - 111 mmol/L 104 103  CO2 22 - 32 mmol/L 24 21(L)  Calcium 8.9 - 10.3 mg/dL 9.5 9.8   CBC Latest Ref Rng & Units 02/12/2019 02/11/2019 02/20/2018  WBC 4.0 - 10.5 K/uL 9.4 8.0 6.7  Hemoglobin 12.0 - 15.0 g/dL 10.7(L) 10.9(L) 12.8  Hematocrit 36.0 - 46.0 % 33.9(L) 33.6(L) 40.5  Platelets 150 - 400 K/uL 301 308 343   Lipid Panel     Component Value Date/Time   CHOL 178 02/11/2019 0257   TRIG 103 02/11/2019 0257   HDL 67 02/11/2019 0257   CHOLHDL 2.7 02/11/2019 0257   VLDL 21 02/11/2019 0257   LDLCALC 90 02/11/2019 0257   HEMOGLOBIN A1C Lab Results  Component Value Date   HGBA1C 6.0 (H) 02/11/2019   MPG 125.5 02/11/2019   TSH Recent Labs    02/11/19 0257  TSH 2.832    PRN Meds:. Medications Discontinued During This Encounter  Medication Reason  . atorvastatin (LIPITOR) 80 MG tablet Error  . HYDROcodone-acetaminophen (NORCO) 10-325 MG tablet Error   Current Meds  Medication Sig  . amLODipine (NORVASC) 5 MG tablet Take 1 tablet (5 mg total) by mouth  daily.  Marland Kitchen aspirin 81 MG EC tablet Take 81 mg by mouth daily.   . B Complex-C (B-COMPLEX WITH VITAMIN C) tablet Take 1 tablet by mouth daily.  Claris Gower Grape-Goldenseal (BERBERINE COMPLEX PO) Take 1 capsule by mouth daily.  . Calcium Carb-Cholecalciferol (CALCIUM 600+D) 600-800 MG-UNIT TABS Take 1 tablet by mouth daily.  . clopidogrel (PLAVIX) 75 MG tablet Take 1 tablet (75 mg total) by mouth daily.  . Coenzyme Q10 (CO Q-10) 200 MG CAPS Take 200 mg by mouth daily.  . Cyanocobalamin (VITAMIN B-12 PO) Take 5,000 mcg by mouth daily.  . fexofenadine (ALLEGRA) 180 MG tablet Take 180 mg by mouth at bedtime.  . fluticasone (FLONASE) 50 MCG/ACT nasal spray Place 2 sprays into both nostrils daily as needed for allergies or rhinitis.  Marland Kitchen gabapentin (NEURONTIN) 300 MG capsule Take 300 mg by mouth daily.   . Glucosamine-Chondroit-Vit C-Mn (GLUCOSAMINE 1500 COMPLEX PO) Take 3,000 mg by mouth daily.  . isosorbide mononitrate (IMDUR) 30 MG 24 hr tablet Take 30 mg by mouth every morning.  Marland Kitchen ketoconazole (NIZORAL) 2 % shampoo Apply 1 application topically See admin instructions. 2 to 3 times a week  . levothyroxine (SYNTHROID, LEVOTHROID) 50 MCG tablet Take 50 mcg by mouth daily before breakfast.  . magnesium oxide (MAG-OX) 400 MG tablet Take 800 mg by mouth daily.  . Melatonin 5 MG TABS Take 5 mg by mouth at bedtime.  . metFORMIN (GLUCOPHAGE) 500 MG tablet Take 1 tablet (500 mg total) by mouth daily. (Patient taking differently: Take 500 mg by mouth 2 (two) times daily with a meal. )  . nitroGLYCERIN (NITROSTAT) 0.4 MG SL tablet Place 1 tablet (0.4 mg total) under the tongue every 5 (five) minutes as needed for chest pain.  Marland Kitchen nystatin cream (MYCOSTATIN) Apply 1 application topically 2 (two) times daily. Mix with triamcinolone and apply to face  . Omega-3 Fatty Acids (FISH OIL) 1200 MG CAPS Take 1,200 mg by mouth daily.  . RABEprazole (ACIPHEX) 20 MG tablet Take 20 mg by mouth daily.  . Red Yeast Rice 600  MG CAPS Take 2 capsules by mouth daily.  Marland Kitchen tiZANidine (ZANAFLEX) 4  MG tablet Take 4 mg by mouth at bedtime.   . triamcinolone cream (KENALOG) 0.1 % Apply 1 application topically 2 (two) times daily. Mix with nystatin and apply to face  . TURMERIC PO Take 538 mg by mouth daily.    Cardiac Studies:   Left Heart Catheterization 02/12/19:   Mid LAD lesion is 50% stenosed. Prox LAD lesion is 30% stenosed. Distal LAD 30%. Normal moderate sized Cx and large dominant RCA.  The left ventricular systolic function is normal. LV end diastolic pressure is normal.  Recommendation: Patient will be discharged home today, medical therapy for moderate coronary artery disease. Suspect she had an ulcerated proximal LAD and a mid to distal LAD stenosis however the stenosis now appears to be much more stable. Aspirin indefinitely and Plavix for 3 to 6 months and start statins high-dose high intensity. Due to bradycardia, metoprolol was not started, will be started on amlodipine and will discontinue Benicar for now and also hydrochlorothiazide for now as blood pressure has been soft. This can be adjusted in the outpatient basis.  Assessment:   Atherosclerosis of native coronary artery of native heart without angina pectoris - Plan: EKG 12-Lead, CANCELED: EKG 12-Lead  Primary hypertension  Type 2 diabetes mellitus without complication, without long-term current use of insulin (HCC)  Pure hypercholesterolemia  EKG 02/26/2019: Normal sinus rhythm at 81 bpm, normal axis, nonspecific T wave abnormality, cannot exclude ischemia.   Recommendations:   Patient presents for hospital follow-up after recently presenting with NSTEMI.  She was noted to have moderate CAD by coronary angiogram, she was suspected to have ulcerated proximal LAD and mid to distal LAD stenosis however appeared to be much more stable on cath.  Also suspected vasospasm and was started on amlodipine.  She has had one episode of angina since her  hospitalization, will continue with medical management.  Encouraged her to start cardiac rehab. She will continue with DAPT for at least 3-6 months, then aspirin indefinitely.    Her blood pressure has been markedly elevated since being discharged, she was started on Imdur by her PCP.  We will continue with this in view of her recent angina.  We will resume her Benicar back at 20 mg daily.  We will need to recheck BMP in 2 weeks for surveillance of her kidney function. She is not noted to be bradycardic today, could consider addition of Metoprolol if needed. I would recommend continuing with amlodipine to help with potential vasospasm.   She does have aortic systolic ejection murmur on exam, that she states is not new for her. Also has bilateral carotid bruits (R>L). I will obtain echocardiogram and carotid duplex for further evaluation. No clinical evidence of heart failure.   I have discussed with the patient the importance of controlling her risk factors to help prevent future events. Diabetes is managed by her PCP and appears to be well controlled. I have discussed the indications for statin therapy especially in view of her recent NSTEMI. Her lipids are not markedly elevated, but do feel that she would benefit from at least low dose statin. I will start Crestor 10 mg daily. She wishes to start with half a tablet for a few weeks, and if she tolerates, will increase to 1 tablet daily. She will need repeat lipids in the next few weeks for follow up. I would also recommend diet and exercise to promote weight loss that can also help reduce her risk. I will see her back in 3 weeks for  follow up on hypertension, but encouraged her to contact me sooner if needed.    *I have discussed this case with Dr. Einar Gip and he participated in formulating the plan.*   Miquel Dunn, MSN, APRN, FNP-C [facility]. [place] Office: [phone] Fax: [phone]

## 2019-03-05 ENCOUNTER — Other Ambulatory Visit: Payer: Medicare Other

## 2019-03-11 DIAGNOSIS — R29702 NIHSS score 2: Secondary | ICD-10-CM | POA: Diagnosis not present

## 2019-03-11 DIAGNOSIS — I361 Nonrheumatic tricuspid (valve) insufficiency: Secondary | ICD-10-CM | POA: Diagnosis not present

## 2019-03-11 DIAGNOSIS — Z87891 Personal history of nicotine dependence: Secondary | ICD-10-CM | POA: Diagnosis not present

## 2019-03-11 DIAGNOSIS — I350 Nonrheumatic aortic (valve) stenosis: Secondary | ICD-10-CM | POA: Diagnosis not present

## 2019-03-11 DIAGNOSIS — E1169 Type 2 diabetes mellitus with other specified complication: Secondary | ICD-10-CM | POA: Diagnosis not present

## 2019-03-11 DIAGNOSIS — Z7982 Long term (current) use of aspirin: Secondary | ICD-10-CM | POA: Diagnosis not present

## 2019-03-11 DIAGNOSIS — M1990 Unspecified osteoarthritis, unspecified site: Secondary | ICD-10-CM | POA: Diagnosis not present

## 2019-03-11 DIAGNOSIS — G459 Transient cerebral ischemic attack, unspecified: Secondary | ICD-10-CM | POA: Diagnosis not present

## 2019-03-11 DIAGNOSIS — I10 Essential (primary) hypertension: Secondary | ICD-10-CM | POA: Diagnosis not present

## 2019-03-11 DIAGNOSIS — I2510 Atherosclerotic heart disease of native coronary artery without angina pectoris: Secondary | ICD-10-CM | POA: Diagnosis not present

## 2019-03-11 DIAGNOSIS — Z7902 Long term (current) use of antithrombotics/antiplatelets: Secondary | ICD-10-CM | POA: Diagnosis not present

## 2019-03-11 DIAGNOSIS — R29898 Other symptoms and signs involving the musculoskeletal system: Secondary | ICD-10-CM | POA: Diagnosis not present

## 2019-03-11 DIAGNOSIS — I6522 Occlusion and stenosis of left carotid artery: Secondary | ICD-10-CM | POA: Diagnosis not present

## 2019-03-11 DIAGNOSIS — E669 Obesity, unspecified: Secondary | ICD-10-CM | POA: Diagnosis not present

## 2019-03-11 DIAGNOSIS — R531 Weakness: Secondary | ICD-10-CM | POA: Diagnosis not present

## 2019-03-11 DIAGNOSIS — Z79899 Other long term (current) drug therapy: Secondary | ICD-10-CM | POA: Diagnosis not present

## 2019-03-11 DIAGNOSIS — Z7984 Long term (current) use of oral hypoglycemic drugs: Secondary | ICD-10-CM | POA: Diagnosis not present

## 2019-03-11 DIAGNOSIS — I252 Old myocardial infarction: Secondary | ICD-10-CM | POA: Diagnosis not present

## 2019-03-11 DIAGNOSIS — E039 Hypothyroidism, unspecified: Secondary | ICD-10-CM | POA: Diagnosis not present

## 2019-03-11 DIAGNOSIS — R6889 Other general symptoms and signs: Secondary | ICD-10-CM | POA: Diagnosis not present

## 2019-03-11 DIAGNOSIS — R29818 Other symptoms and signs involving the nervous system: Secondary | ICD-10-CM | POA: Diagnosis not present

## 2019-03-11 DIAGNOSIS — I639 Cerebral infarction, unspecified: Secondary | ICD-10-CM | POA: Diagnosis not present

## 2019-03-11 DIAGNOSIS — F419 Anxiety disorder, unspecified: Secondary | ICD-10-CM | POA: Diagnosis not present

## 2019-03-12 DIAGNOSIS — I10 Essential (primary) hypertension: Secondary | ICD-10-CM | POA: Diagnosis not present

## 2019-03-12 DIAGNOSIS — I639 Cerebral infarction, unspecified: Secondary | ICD-10-CM | POA: Diagnosis not present

## 2019-03-12 DIAGNOSIS — E1169 Type 2 diabetes mellitus with other specified complication: Secondary | ICD-10-CM | POA: Diagnosis not present

## 2019-03-12 DIAGNOSIS — G459 Transient cerebral ischemic attack, unspecified: Secondary | ICD-10-CM | POA: Diagnosis not present

## 2019-03-13 DIAGNOSIS — R001 Bradycardia, unspecified: Secondary | ICD-10-CM | POA: Insufficient documentation

## 2019-03-13 DIAGNOSIS — Z7982 Long term (current) use of aspirin: Secondary | ICD-10-CM | POA: Insufficient documentation

## 2019-03-13 DIAGNOSIS — E782 Mixed hyperlipidemia: Secondary | ICD-10-CM | POA: Insufficient documentation

## 2019-03-13 DIAGNOSIS — I639 Cerebral infarction, unspecified: Secondary | ICD-10-CM | POA: Diagnosis not present

## 2019-03-13 DIAGNOSIS — I2510 Atherosclerotic heart disease of native coronary artery without angina pectoris: Secondary | ICD-10-CM | POA: Insufficient documentation

## 2019-03-13 DIAGNOSIS — I10 Essential (primary) hypertension: Secondary | ICD-10-CM | POA: Insufficient documentation

## 2019-03-19 ENCOUNTER — Ambulatory Visit: Payer: Medicare Other | Admitting: Cardiology

## 2019-03-19 DIAGNOSIS — M7062 Trochanteric bursitis, left hip: Secondary | ICD-10-CM | POA: Diagnosis not present

## 2019-03-19 DIAGNOSIS — Z96642 Presence of left artificial hip joint: Secondary | ICD-10-CM | POA: Diagnosis not present

## 2019-03-19 DIAGNOSIS — Z4789 Encounter for other orthopedic aftercare: Secondary | ICD-10-CM | POA: Diagnosis not present

## 2019-03-20 ENCOUNTER — Other Ambulatory Visit: Payer: Self-pay | Admitting: Cardiology

## 2019-03-21 DIAGNOSIS — Z96642 Presence of left artificial hip joint: Secondary | ICD-10-CM | POA: Insufficient documentation

## 2019-03-27 ENCOUNTER — Other Ambulatory Visit: Payer: Self-pay | Admitting: Physician Assistant

## 2019-03-27 MED ORDER — ROSUVASTATIN CALCIUM 20 MG PO TABS: 20.0000 mg | ORAL_TABLET | Freq: Every day | ORAL | 3 refills | Status: AC

## 2019-03-28 DIAGNOSIS — E7800 Pure hypercholesterolemia, unspecified: Secondary | ICD-10-CM | POA: Diagnosis not present

## 2019-03-28 DIAGNOSIS — R011 Cardiac murmur, unspecified: Secondary | ICD-10-CM | POA: Diagnosis not present

## 2019-03-28 DIAGNOSIS — I252 Old myocardial infarction: Secondary | ICD-10-CM | POA: Diagnosis not present

## 2019-03-28 DIAGNOSIS — Z8673 Personal history of transient ischemic attack (TIA), and cerebral infarction without residual deficits: Secondary | ICD-10-CM | POA: Diagnosis not present

## 2019-03-28 DIAGNOSIS — K219 Gastro-esophageal reflux disease without esophagitis: Secondary | ICD-10-CM | POA: Diagnosis not present

## 2019-03-28 DIAGNOSIS — E039 Hypothyroidism, unspecified: Secondary | ICD-10-CM | POA: Diagnosis not present

## 2019-03-30 DIAGNOSIS — E7800 Pure hypercholesterolemia, unspecified: Secondary | ICD-10-CM | POA: Diagnosis not present

## 2019-03-30 DIAGNOSIS — I252 Old myocardial infarction: Secondary | ICD-10-CM | POA: Diagnosis not present

## 2019-03-30 DIAGNOSIS — K219 Gastro-esophageal reflux disease without esophagitis: Secondary | ICD-10-CM | POA: Diagnosis not present

## 2019-03-30 DIAGNOSIS — R011 Cardiac murmur, unspecified: Secondary | ICD-10-CM | POA: Diagnosis not present

## 2019-03-30 DIAGNOSIS — E039 Hypothyroidism, unspecified: Secondary | ICD-10-CM | POA: Diagnosis not present

## 2019-04-02 DIAGNOSIS — K219 Gastro-esophageal reflux disease without esophagitis: Secondary | ICD-10-CM | POA: Diagnosis not present

## 2019-04-02 DIAGNOSIS — R011 Cardiac murmur, unspecified: Secondary | ICD-10-CM | POA: Diagnosis not present

## 2019-04-02 DIAGNOSIS — E7800 Pure hypercholesterolemia, unspecified: Secondary | ICD-10-CM | POA: Diagnosis not present

## 2019-04-02 DIAGNOSIS — E039 Hypothyroidism, unspecified: Secondary | ICD-10-CM | POA: Diagnosis not present

## 2019-04-02 DIAGNOSIS — I252 Old myocardial infarction: Secondary | ICD-10-CM | POA: Diagnosis not present

## 2019-04-04 DIAGNOSIS — I252 Old myocardial infarction: Secondary | ICD-10-CM | POA: Diagnosis not present

## 2019-04-04 DIAGNOSIS — K219 Gastro-esophageal reflux disease without esophagitis: Secondary | ICD-10-CM | POA: Diagnosis not present

## 2019-04-04 DIAGNOSIS — R011 Cardiac murmur, unspecified: Secondary | ICD-10-CM | POA: Diagnosis not present

## 2019-04-04 DIAGNOSIS — E7800 Pure hypercholesterolemia, unspecified: Secondary | ICD-10-CM | POA: Diagnosis not present

## 2019-04-04 DIAGNOSIS — E039 Hypothyroidism, unspecified: Secondary | ICD-10-CM | POA: Diagnosis not present

## 2019-04-05 ENCOUNTER — Ambulatory Visit: Payer: Medicare Other | Admitting: Family Medicine

## 2019-04-06 DIAGNOSIS — R011 Cardiac murmur, unspecified: Secondary | ICD-10-CM | POA: Diagnosis not present

## 2019-04-06 DIAGNOSIS — E7800 Pure hypercholesterolemia, unspecified: Secondary | ICD-10-CM | POA: Diagnosis not present

## 2019-04-06 DIAGNOSIS — E039 Hypothyroidism, unspecified: Secondary | ICD-10-CM | POA: Diagnosis not present

## 2019-04-06 DIAGNOSIS — K219 Gastro-esophageal reflux disease without esophagitis: Secondary | ICD-10-CM | POA: Diagnosis not present

## 2019-04-06 DIAGNOSIS — I252 Old myocardial infarction: Secondary | ICD-10-CM | POA: Diagnosis not present

## 2019-04-09 DIAGNOSIS — I350 Nonrheumatic aortic (valve) stenosis: Secondary | ICD-10-CM | POA: Insufficient documentation

## 2019-04-09 DIAGNOSIS — E785 Hyperlipidemia, unspecified: Secondary | ICD-10-CM | POA: Diagnosis not present

## 2019-04-09 DIAGNOSIS — K219 Gastro-esophageal reflux disease without esophagitis: Secondary | ICD-10-CM | POA: Diagnosis not present

## 2019-04-09 DIAGNOSIS — Z7982 Long term (current) use of aspirin: Secondary | ICD-10-CM | POA: Diagnosis not present

## 2019-04-09 DIAGNOSIS — I252 Old myocardial infarction: Secondary | ICD-10-CM | POA: Insufficient documentation

## 2019-04-09 DIAGNOSIS — I10 Essential (primary) hypertension: Secondary | ICD-10-CM | POA: Diagnosis not present

## 2019-04-09 DIAGNOSIS — E039 Hypothyroidism, unspecified: Secondary | ICD-10-CM | POA: Diagnosis not present

## 2019-04-09 DIAGNOSIS — I2510 Atherosclerotic heart disease of native coronary artery without angina pectoris: Secondary | ICD-10-CM | POA: Diagnosis not present

## 2019-04-09 DIAGNOSIS — E782 Mixed hyperlipidemia: Secondary | ICD-10-CM | POA: Diagnosis not present

## 2019-04-09 DIAGNOSIS — R072 Precordial pain: Secondary | ICD-10-CM | POA: Diagnosis not present

## 2019-04-09 DIAGNOSIS — I6523 Occlusion and stenosis of bilateral carotid arteries: Secondary | ICD-10-CM | POA: Diagnosis not present

## 2019-04-09 DIAGNOSIS — Z8673 Personal history of transient ischemic attack (TIA), and cerebral infarction without residual deficits: Secondary | ICD-10-CM | POA: Insufficient documentation

## 2019-04-09 DIAGNOSIS — E7800 Pure hypercholesterolemia, unspecified: Secondary | ICD-10-CM | POA: Diagnosis not present

## 2019-04-09 DIAGNOSIS — I214 Non-ST elevation (NSTEMI) myocardial infarction: Secondary | ICD-10-CM | POA: Diagnosis not present

## 2019-04-09 DIAGNOSIS — R011 Cardiac murmur, unspecified: Secondary | ICD-10-CM | POA: Diagnosis not present

## 2019-04-11 DIAGNOSIS — E039 Hypothyroidism, unspecified: Secondary | ICD-10-CM | POA: Diagnosis not present

## 2019-04-11 DIAGNOSIS — R011 Cardiac murmur, unspecified: Secondary | ICD-10-CM | POA: Diagnosis not present

## 2019-04-11 DIAGNOSIS — E7800 Pure hypercholesterolemia, unspecified: Secondary | ICD-10-CM | POA: Diagnosis not present

## 2019-04-11 DIAGNOSIS — I252 Old myocardial infarction: Secondary | ICD-10-CM | POA: Diagnosis not present

## 2019-04-11 DIAGNOSIS — K219 Gastro-esophageal reflux disease without esophagitis: Secondary | ICD-10-CM | POA: Diagnosis not present

## 2019-04-13 ENCOUNTER — Other Ambulatory Visit: Payer: Self-pay

## 2019-04-13 ENCOUNTER — Ambulatory Visit (INDEPENDENT_AMBULATORY_CARE_PROVIDER_SITE_OTHER): Payer: Medicare Other

## 2019-04-13 ENCOUNTER — Ambulatory Visit: Payer: Medicare Other

## 2019-04-13 VITALS — BP 122/78 | HR 79 | Temp 97.9°F | Resp 16 | Ht 63.0 in | Wt 207.6 lb

## 2019-04-13 DIAGNOSIS — R011 Cardiac murmur, unspecified: Secondary | ICD-10-CM | POA: Diagnosis not present

## 2019-04-13 DIAGNOSIS — E7800 Pure hypercholesterolemia, unspecified: Secondary | ICD-10-CM | POA: Diagnosis not present

## 2019-04-13 DIAGNOSIS — Z780 Asymptomatic menopausal state: Secondary | ICD-10-CM

## 2019-04-13 DIAGNOSIS — E039 Hypothyroidism, unspecified: Secondary | ICD-10-CM | POA: Diagnosis not present

## 2019-04-13 DIAGNOSIS — Z23 Encounter for immunization: Secondary | ICD-10-CM | POA: Diagnosis not present

## 2019-04-13 DIAGNOSIS — K219 Gastro-esophageal reflux disease without esophagitis: Secondary | ICD-10-CM | POA: Diagnosis not present

## 2019-04-13 DIAGNOSIS — Z0000 Encounter for general adult medical examination without abnormal findings: Secondary | ICD-10-CM | POA: Diagnosis not present

## 2019-04-13 DIAGNOSIS — I252 Old myocardial infarction: Secondary | ICD-10-CM | POA: Diagnosis not present

## 2019-04-13 NOTE — Progress Notes (Deleted)
Subjective:   Megan Aguilar is a 69 y.o. female who presents for Medicare Annual (Subsequent) preventive examination.  Review of Systems:  ***       Objective:     Vitals: There were no vitals taken for this visit.  There is no height or weight on file to calculate BMI.  Advanced Directives 02/11/2019 02/24/2018 02/24/2018 02/20/2018  Does Patient Have a Medical Advance Directive? No Yes Yes Yes  Type of Advance Directive - [place];Living will [place];Living will -  Does patient want to make changes to medical advance directive? - No - Patient declined - -  Copy of [place] in Chart? - No - copy requested No - copy requested -  Would patient like information on creating a medical advance directive? No - Patient declined - - -    Tobacco Social History   Tobacco Use  Smoking Status Former Smoker  . Packs/day: 2.00  . Years: 30.00  . Pack years: 60.00  . Types: Cigarettes  . Quit date: 02/21/2004  . Years since quitting: 15.1  Smokeless Tobacco Never Used     Counseling given: Not Answered   Clinical Intake:  Pre-visit preparation completed: Yes                    Past Medical History:  Diagnosis Date  . Adult acne   . Arthritis    "all over" (02/24/2018)  . Chronic bronchitis ([place])   . GERD (gastroesophageal reflux disease)   . Headache    "at least 1/wk" (02/24/2018)  . Heart murmur    AS A 69 YR OLD...HASN'T BEEN A PROBLEM SINCE THEN.   . High cholesterol   . Hypertension   . Hypothyroidism    Past Surgical History:  Procedure Laterality Date  . ANTERIOR / POSTERIOR COMBINED FUSION CERVICAL SPINE  02/24/2018   C3-4; C7-T1  . ANTERIOR CERVICAL DECOMP/DISCECTOMY FUSION  2006  . ANTERIOR CERVICAL DECOMP/DISCECTOMY FUSION N/A 02/24/2018   Procedure: Anterior Cervical Decompression Fusion  - Cervical three-Cervical four;  Surgeon: Eustace Moore, MD;  Location: [facility];  Service:  Neurosurgery;  Laterality: N/A;  . BACK SURGERY    . JOINT REPLACEMENT    . LAPAROSCOPIC CHOLECYSTECTOMY    . LEFT HEART CATH AND CORONARY ANGIOGRAPHY N/A 02/12/2019   Procedure: LEFT HEART CATH AND CORONARY ANGIOGRAPHY;  Surgeon: [name], MD;  Location: [facility];  Service: Cardiovascular;  Laterality: N/A;  . POSTERIOR CERVICAL FUSION/FORAMINOTOMY N/A 02/24/2018   Procedure: Posterior cervical decompression and lateral mass fusion Cervical seven -Thoracic one;  Surgeon: Eustace Moore, MD;  Location: [facility];  Service: Neurosurgery;  Laterality: N/A;  . TOTAL ABDOMINAL HYSTERECTOMY  2017  . TOTAL HIP ARTHROPLASTY Right 07/2017  . TUBAL LIGATION  1977   Family History  Problem Relation Age of Onset  . Diabetes Mother   . COPD Father   . Aortic stenosis Father   . Alzheimer's disease Father   . Congestive Heart Failure Father   . Diabetes Sister   . Lung cancer Brother   . Hyperlipidemia Brother   . Hypertension Brother    Social History   Socioeconomic History  . Marital status: Married    Spouse name: Not on file  . Number of children: 2  . Years of education: Not on file  . Highest education level: Not on file  Occupational History  . Not on file  Tobacco Use  . Smoking status: Former Smoker    Packs/day: 2.00    Years: 30.00    Pack years: 60.00    Types: Cigarettes    Quit date: 02/21/2004    Years since quitting: 15.1  . Smokeless tobacco: Never Used  Substance and Sexual Activity  . Alcohol use: Never  . Drug use: Never  . Sexual activity: Not Currently  Other Topics Concern  . Not on file  Social History Narrative  . Not on file   Social Determinants of Health   Financial Resource Strain:   . Difficulty of Paying Living Expenses: Not on file  Food Insecurity:   . Worried About Programme researcher, broadcasting/film/videounning Out of Food in the Last Year: Not on file  . Ran Out of Food in the Last Year: Not on file  Transportation Needs:   . Lack of Transportation (Medical): Not on  file  . Lack of Transportation (Non-Medical): Not on file  Physical Activity:   . Days of Exercise per Week: Not on file  . Minutes of Exercise per Session: Not on file  Stress:   . Feeling of Stress : Not on file  Social Connections:   . Frequency of Communication with Friends and Family: Not on file  . Frequency of Social Gatherings with Friends and Family: Not on file  . Attends Religious Services: Not on file  . Active Member of Clubs or Organizations: Not on file  . Attends BankerClub or Organization Meetings: Not on file  . Marital Status: Not on file    Outpatient Encounter Medications as of 04/13/2019  Medication Sig  . amLODipine (NORVASC) 5 MG tablet Take 1 tablet (5 mg total) by mouth daily.  Marland Kitchen. aspirin 81 MG EC tablet Take 81 mg by mouth daily.   . B Complex-C (B-COMPLEX WITH VITAMIN C) tablet Take 1 tablet by mouth daily.  Claris Gower. Barberry-Oreg Grape-Goldenseal (BERBERINE COMPLEX PO) Take 1 capsule by mouth daily.  . Calcium Carb-Cholecalciferol (CALCIUM 600+D) 600-800 MG-UNIT TABS Take 1 tablet by mouth daily.  . clopidogrel (PLAVIX) 75 MG tablet Take 1 tablet (75 mg total) by mouth daily.  . Coenzyme Q10 (CO Q-10) 200 MG CAPS Take 200 mg by mouth daily.  . Cyanocobalamin (VITAMIN B-12 PO) Take 5,000 mcg by mouth daily.  . fexofenadine (ALLEGRA) 180 MG tablet Take 180 mg by mouth at bedtime.  . fluticasone (FLONASE) 50 MCG/ACT nasal spray Place 2 sprays into both nostrils daily as needed for allergies or rhinitis.  Marland Kitchen. gabapentin (NEURONTIN) 300 MG capsule Take 300 mg by mouth daily.   . Glucosamine-Chondroit-Vit C-Mn (GLUCOSAMINE 1500 COMPLEX PO) Take 3,000 mg by mouth daily.  . isosorbide mononitrate (IMDUR) 30 MG 24 hr tablet Take 30 mg by mouth every morning.  Marland Kitchen. ketoconazole (NIZORAL) 2 % shampoo Apply 1 application topically See admin instructions. 2 to 3 times a week  . levothyroxine (SYNTHROID, LEVOTHROID) 50 MCG tablet Take 50 mcg by mouth daily before breakfast.  . magnesium  oxide (MAG-OX) 400 MG tablet Take 800 mg by mouth daily.  . Melatonin 5 MG TABS Take 5 mg by mouth at bedtime.  . metFORMIN (GLUCOPHAGE) 500 MG tablet Take 1 tablet (500 mg total) by mouth daily. (Patient taking differently: Take 500 mg by mouth 2 (two) times daily with a meal. )  . nitroGLYCERIN (NITROSTAT) 0.4 MG SL tablet Place 1 tablet (0.4 mg total) under the tongue every 5 (five) minutes as needed for chest pain.  Marland Kitchen. nystatin cream (MYCOSTATIN)  Apply 1 application topically 2 (two) times daily. Mix with triamcinolone and apply to face  . olmesartan (BENICAR) 20 MG tablet TAKE 1 TABLET BY MOUTH EVERY DAY  . Omega-3 Fatty Acids (FISH OIL) 1200 MG CAPS Take 1,200 mg by mouth daily.  . RABEprazole (ACIPHEX) 20 MG tablet Take 20 mg by mouth daily.  . rosuvastatin (CRESTOR) 20 MG tablet Take 1 tablet (20 mg total) by mouth daily.  Marland Kitchen tiZANidine (ZANAFLEX) 4 MG tablet Take 4 mg by mouth at bedtime.   . triamcinolone cream (KENALOG) 0.1 % Apply 1 application topically 2 (two) times daily. Mix with nystatin and apply to face  . TURMERIC PO Take 538 mg by mouth daily.   No facility-administered encounter medications on file as of 04/13/2019.    Activities of Daily Living In your present state of health, do you have any difficulty performing the following activities: 02/11/2019  Hearing? N  Vision? N  Difficulty concentrating or making decisions? N  Walking or climbing stairs? Y  Dressing or bathing? N  Doing errands, shopping? N  Some recent data might be hidden    Patient Care Team: Blane Ohara, MD as PCP - General (Family Medicine)    Assessment:   This is a routine wellness examination for Kellee.  Exercise Activities and Dietary recommendations    Goals   None     Fall Risk No flowsheet data found. Is the patient's home free of loose throw rugs in walkways, pet beds, electrical cords, etc?   {Blank single:19197::"yes","no"}      Grab bars in the bathroom? {Blank  single:19197::"yes","no"}      Handrails on the stairs?   {Blank single:19197::"yes","no"}      Adequate lighting?   {Blank single:19197::"yes","no"}  Timed Get Up and Go performed: ***  Depression Screen No flowsheet data found.   Cognitive Function        Immunization History  Administered Date(s) Administered  . Influenza-Unspecified 11/10/2018  . Pneumococcal Conjugate-13 03/25/2014  . Pneumococcal Polysaccharide-23 05/07/2011  . Tdap 05/02/2012  . Zoster 07/16/2011    Qualifies for Shingles Vaccine?***  Screening Tests Health Maintenance  Topic Date Due  . Hepatitis C Screening  1950/07/17  . PNA vac Low Risk Adult (2 of 2 - PPSV23) 05/06/2016  . DEXA SCAN  05/07/2019  . MAMMOGRAM  12/01/2019  . COLONOSCOPY  08/02/2021  . TETANUS/TDAP  05/03/2022  . INFLUENZA VACCINE  Completed    Cancer Screenings: Lung: Low Dose CT Chest recommended if Age 24-80 years, 30 pack-year currently smoking OR have quit w/in 15years. Patient {DOES NOT does:27190::"does not"} qualify. Breast:  Up to date on Mammogram? {Yes/No:30480221}   Up to date of Bone Density/Dexa? {Yes/No:30480221} Colorectal: ***  Additional Screenings: ***: Hepatitis C Screening:      Plan:   ***   I have personally reviewed and noted the following in the patient's chart:   . Medical and social history . Use of alcohol, tobacco or illicit drugs  . Current medications and supplements . Functional ability and status . Nutritional status . Physical activity . Advanced directives . List of other physicians . Hospitalizations, surgeries, and ER visits in previous 12 months . Vitals . Screenings to include cognitive, depression, and falls . Referrals and appointments  In addition, I have reviewed and discussed with patient certain preventive protocols, quality metrics, and best practice recommendations. A written personalized care plan for preventive services as well as general preventive health  recommendations were provided to patient.  Jacklynn BueKimberly M Ebbie Cherry, LPN  8/41/32442/26/2021      Subjective:   Megan Aguilar is a 69 y.o. female who presents for Medicare Annual (Subsequent) preventive examination.  Review of Systems:  *** Cardiac Risk Factors include: advanced age (>3255men, 47>65 women);diabetes mellitus;hypertension;obesity (BMI >30kg/m2)     Objective:     Vitals: BP 122/78 (BP Location: Left Arm, Patient Position: Sitting, Cuff Size: Large)   Pulse 79   Temp 97.9 F (36.6 C)   Resp 16   Ht 5\' 3"  (1.6 m)   Wt 207 lb 9.6 oz (94.2 kg)   SpO2 96%   BMI 36.77 kg/m   Body mass index is 36.77 kg/m.  Advanced Directives 04/13/2019 02/11/2019 02/24/2018 02/24/2018 02/20/2018  Does Patient Have a Medical Advance Directive? Yes No Yes Yes Yes  Type of Estate agentAdvance Directive Healthcare Power of [place];Living will - Healthcare Power of [place];Living will Healthcare Power of [place];Living will -  Does patient want to make changes to medical advance directive? No - Patient declined - No - Patient declined - -  Copy of Healthcare Power of Attorney in Chart? No - copy requested - No - copy requested No - copy requested -  Would patient like information on creating a medical advance directive? - No - Patient declined - - -    Tobacco Social History   Tobacco Use  Smoking Status Former Smoker  . Packs/day: 2.00  . Years: 30.00  . Pack years: 60.00  . Types: Cigarettes  . Quit date: 02/21/2004  . Years since quitting: 15.1  Smokeless Tobacco Never Used     Counseling given: Not Answered   Clinical Intake:  Pre-visit preparation completed: Yes  Pain : No/denies pain Pain Score: 0-No pain     BMI - recorded: 36.77 Nutritional Status: BMI > 30  Obese Nutritional Risks: None Diabetes: Yes CBG done?: No Did pt. bring in CBG monitor from home?: No  How often do you need to have someone help you when you read instructions, pamphlets, or other written materials from your  doctor or pharmacy?: 1 - Never  Interpreter Needed?: No     Past Medical History:  Diagnosis Date  . Adult acne   . Arthritis    "all over" (02/24/2018)  . Chronic bronchitis ([facility])   . GERD (gastroesophageal reflux disease)   . Headache    "at least 1/wk" (02/24/2018)  . Heart murmur    AS A 69 YR OLD...HASN'T BEEN A PROBLEM SINCE THEN.   . High cholesterol   . Hypertension   . Hypothyroidism    Past Surgical History:  Procedure Laterality Date  . ANTERIOR / POSTERIOR COMBINED FUSION CERVICAL SPINE  02/24/2018   C3-4; C7-T1  . ANTERIOR CERVICAL DECOMP/DISCECTOMY FUSION  2006  . ANTERIOR CERVICAL DECOMP/DISCECTOMY FUSION N/A 02/24/2018   Procedure: Anterior Cervical Decompression Fusion  - Cervical three-Cervical four;  Surgeon: Tia AlertJones, David S, MD;  [facility];  Service: Neurosurgery;  Laterality: N/A;  . BACK SURGERY    . JOINT REPLACEMENT    . LAPAROSCOPIC CHOLECYSTECTOMY    . LEFT HEART CATH AND CORONARY ANGIOGRAPHY N/A 02/12/2019   Procedure: LEFT HEART CATH AND CORONARY ANGIOGRAPHY;  Surgeon: Yates DecampGanji, Jay, MD;  [facility];  Service: Cardiovascular;  Laterality: N/A;  . POSTERIOR CERVICAL FUSION/FORAMINOTOMY N/A 02/24/2018   Procedure: Posterior cervical decompression and lateral mass fusion Cervical seven -Thoracic one;  Surgeon: Tia AlertJones, David S, MD;  [facility];  Service: Neurosurgery;  Laterality:  N/A;  . TOTAL ABDOMINAL HYSTERECTOMY  2017  . TOTAL HIP ARTHROPLASTY Right 07/2017  . TUBAL LIGATION  1977   Family History  Problem Relation Age of Onset  . Diabetes Mother   . COPD Father   . Aortic stenosis Father   . Alzheimer's disease Father   . Congestive Heart Failure Father   . Diabetes Sister   . Lung cancer Brother   . Hyperlipidemia Brother   . Hypertension Brother    Social History   Socioeconomic History  . Marital status: Married    Spouse name: Not on file  . Number of children: 2  . Years of education: Not on file  .  Highest education level: Not on file  Occupational History  . Not on file  Tobacco Use  . Smoking status: Former Smoker    Packs/day: 2.00    Years: 30.00    Pack years: 60.00    Types: Cigarettes    Quit date: 02/21/2004    Years since quitting: 15.1  . Smokeless tobacco: Never Used  Substance and Sexual Activity  . Alcohol use: Never  . Drug use: Never  . Sexual activity: Not Currently  Other Topics Concern  . Not on file  Social History Narrative  . Not on file   Social Determinants of Health   Financial Resource Strain:   . Difficulty of Paying Living Expenses: Not on file  Food Insecurity:   . Worried About Programme researcher, broadcasting/film/video in the Last Year: Not on file  . Ran Out of Food in the Last Year: Not on file  Transportation Needs:   . Lack of Transportation (Medical): Not on file  . Lack of Transportation (Non-Medical): Not on file  Physical Activity:   . Days of Exercise per Week: Not on file  . Minutes of Exercise per Session: Not on file  Stress:   . Feeling of Stress : Not on file  Social Connections:   . Frequency of Communication with Friends and Family: Not on file  . Frequency of Social Gatherings with Friends and Family: Not on file  . Attends Religious Services: Not on file  . Active Member of Clubs or Organizations: Not on file  . Attends Banker Meetings: Not on file  . Marital Status: Not on file    Outpatient Encounter Medications as of 04/13/2019  Medication Sig  . amLODipine (NORVASC) 5 MG tablet Take 1 tablet (5 mg total) by mouth daily.  Marland Kitchen aspirin 81 MG EC tablet Take 81 mg by mouth daily.   . B Complex-C (B-COMPLEX WITH VITAMIN C) tablet Take 1 tablet by mouth daily.  Claris Gower Grape-Goldenseal (BERBERINE COMPLEX PO) Take 1 capsule by mouth daily.  . Calcium Carb-Cholecalciferol (CALCIUM 600+D) 600-800 MG-UNIT TABS Take 1 tablet by mouth daily.  . clopidogrel (PLAVIX) 75 MG tablet Take 1 tablet (75 mg total) by mouth daily.  .  Coenzyme Q10 (CO Q-10) 200 MG CAPS Take 200 mg by mouth daily.  . Cyanocobalamin (VITAMIN B-12 PO) Take 5,000 mcg by mouth daily.  . fexofenadine (ALLEGRA) 180 MG tablet Take 180 mg by mouth at bedtime.  . fluticasone (FLONASE) 50 MCG/ACT nasal spray Place 2 sprays into both nostrils daily as needed for allergies or rhinitis.  Marland Kitchen FREESTYLE LITE test strip USE TO TEST SUGAR DAILY  . gabapentin (NEURONTIN) 300 MG capsule Take 300 mg by mouth daily.   . Glucosamine-Chondroit-Vit C-Mn (GLUCOSAMINE 1500 COMPLEX PO) Take 3,000 mg by mouth  daily.  . isosorbide mononitrate (IMDUR) 30 MG 24 hr tablet Take 30 mg by mouth every morning.  Marland Kitchen ketoconazole (NIZORAL) 2 % shampoo Apply 1 application topically See admin instructions. 2 to 3 times a week  . levothyroxine (SYNTHROID, LEVOTHROID) 50 MCG tablet Take 50 mcg by mouth daily before breakfast.  . magnesium oxide (MAG-OX) 400 MG tablet Take 800 mg by mouth daily.  . Melatonin 5 MG TABS Take 5 mg by mouth at bedtime.  . metFORMIN (GLUCOPHAGE) 500 MG tablet Take 1 tablet (500 mg total) by mouth daily.  . mupirocin ointment (BACTROBAN) 2 % PLEASE SEE ATTACHED FOR DETAILED DIRECTIONS  . nitroGLYCERIN (NITROSTAT) 0.4 MG SL tablet Place 1 tablet (0.4 mg total) under the tongue every 5 (five) minutes as needed for chest pain.  Marland Kitchen nystatin cream (MYCOSTATIN) Apply 1 application topically 2 (two) times daily. Mix with triamcinolone and apply to face  . olmesartan (BENICAR) 20 MG tablet TAKE 1 TABLET BY MOUTH EVERY DAY  . Omega-3 Fatty Acids (FISH OIL) 1200 MG CAPS Take 1,200 mg by mouth daily.  . RABEprazole (ACIPHEX) 20 MG tablet Take 20 mg by mouth daily.  . rosuvastatin (CRESTOR) 20 MG tablet Take 1 tablet (20 mg total) by mouth daily.  Marland Kitchen tiZANidine (ZANAFLEX) 4 MG tablet Take 4 mg by mouth at bedtime.   . triamcinolone cream (KENALOG) 0.1 % Apply 1 application topically 2 (two) times daily. Mix with nystatin and apply to face  . TURMERIC PO Take 538 mg by mouth  daily.   No facility-administered encounter medications on file as of 04/13/2019.    Activities of Daily Living In your present state of health, do you have any difficulty performing the following activities: 04/13/2019 02/11/2019  Hearing? N N  Vision? N N  Difficulty concentrating or making decisions? N N  Walking or climbing stairs? N Y  Dressing or bathing? N N  Doing errands, shopping? N N  Preparing Food and eating ? N -  Using the Toilet? N -  In the past six months, have you accidently leaked urine? Y -  Do you have problems with loss of bowel control? N -  Managing your Medications? N -  Managing your Finances? N -  Housekeeping or managing your Housekeeping? N -  Some recent data might be hidden    Patient Care Team: Blane Ohara, MD as PCP - General (Family Medicine)    Assessment:   This is a routine wellness examination for Megan Aguilar.  Exercise Activities and Dietary recommendations Current Exercise Habits: Structured exercise class, Type of exercise: treadmill;walking;strength training/weights;Other - see comments(Currently participates in Cardiac Rehab Therapy), Time (Minutes): 30, Frequency (Times/Week): 6, Weekly Exercise (Minutes/Week): 180, Intensity: Moderate, Exercise limited by: cardiac condition(s)  Goals   None     Fall Risk Fall Risk  04/13/2019  Falls in the past year? 0  Risk for fall due to : No Fall Risks   Is the patient's home free of loose throw rugs in walkways, pet beds, electrical cords, etc?   {Blank single:19197::"yes","no"}      Grab bars in the bathroom? {Blank single:19197::"yes","no"}      Handrails on the stairs?   {Blank single:19197::"yes","no"}      Adequate lighting?   {Blank single:19197::"yes","no"}  Timed Get Up and Go performed: ***  Depression Screen PHQ 2/9 Scores 04/13/2019  PHQ - 2 Score 0     Cognitive Function     6CIT Screen 04/13/2019  What Year? 0 points  What month? 0 points  What time? 0 points  Count back  from 20 0 points  Months in reverse 0 points  Repeat phrase 0 points  Total Score 0    Immunization History  Administered Date(s) Administered  . Influenza-Unspecified 11/10/2018  . Pneumococcal Conjugate-13 03/25/2014  . Pneumococcal Polysaccharide-23 05/07/2011  . Tdap 05/02/2012  . Zoster 07/16/2011    Qualifies for Shingles Vaccine?***  Screening Tests Health Maintenance  Topic Date Due  . Hepatitis C Screening  May 19, 1950  . PNA vac Low Risk Adult (2 of 2 - PPSV23) 05/06/2016  . DEXA SCAN  05/07/2019  . MAMMOGRAM  12/01/2019  . COLONOSCOPY  08/02/2021  . TETANUS/TDAP  05/03/2022  . INFLUENZA VACCINE  Completed    Cancer Screenings: Lung: Low Dose CT Chest recommended if Age 47-80 years, 30 pack-year currently smoking OR have quit w/in 15years. Patient {DOES NOT does:27190::"does not"} qualify. Breast:  Up to date on Mammogram? {Yes/No:30480221}   Up to date of Bone Density/Dexa? {Yes/No:30480221} Colorectal: ***  Additional Screenings: ***: Hepatitis C Screening:      Plan:   ***   I have personally reviewed and noted the following in the patient's chart:   . Medical and social history . Use of alcohol, tobacco or illicit drugs  . Current medications and supplements . Functional ability and status . Nutritional status . Physical activity . Advanced directives . List of other physicians . Hospitalizations, surgeries, and ER visits in previous 12 months . Vitals . Screenings to include cognitive, depression, and falls . Referrals and appointments  In addition, I have reviewed and discussed with patient certain preventive protocols, quality metrics, and best practice recommendations. A written personalized care plan for preventive services as well as general preventive health recommendations were provided to patient.     [name], LPN  1/61/0960

## 2019-04-13 NOTE — Patient Instructions (Addendum)
 Bone Density Test The bone density test uses a special type of X-ray to measure the amount of calcium and other minerals in your bones. It can measure bone density in the hip and the spine. The test procedure is similar to having a regular X-ray. This test may also be called:  Bone densitometry.  Bone mineral density test.  Dual-energy X-ray absorptiometry (DEXA). You may have this test to:  Diagnose a condition that causes weak or thin bones (osteoporosis).  Screen you for osteoporosis.  Predict your risk for a broken bone (fracture).  Determine how well your osteoporosis treatment is working. Tell a health care provider about:  Any allergies you have.  All medicines you are taking, including vitamins, herbs, eye drops, creams, and over-the-counter medicines.  Any problems you or family members have had with anesthetic medicines.  Any blood disorders you have.  Any surgeries you have had.  Any medical conditions you have.  Whether you are pregnant or may be pregnant.  Any medical tests you have had within the past 14 days that used contrast material. What are the risks? Generally, this is a safe procedure. However, it does expose you to a small amount of radiation, which can slightly increase your cancer risk. What happens before the procedure?  Do not take any calcium supplements starting 24 hours before your test.  Remove all metal jewelry, eyeglasses, dental appliances, and any other metal objects. What happens during the procedure?   You will lie down on an exam table. There will be an X-ray generator below you and an imaging device above you.  Other devices, such as boxes or braces, may be used to position your body properly for the scan.  The machine will slowly scan your body. You will need to keep still.  The images will show up on a screen in the room. Images will be examined by a specialist after your test is done. The procedure may vary among health  care providers and hospitals. What happens after the procedure?  It is up to you to get your test results. Ask your health care provider, or the department that is doing the test, when your results will be ready. Summary  A bone density test is an imaging test that uses a type of X-ray to measure the amount of calcium and other minerals in your bones.  The test may be used to diagnose or screen you for a condition that causes weak or thin bones (osteoporosis), predict your risk for a broken bone (fracture), or determine how well your osteoporosis treatment is working.  Do not take any calcium supplements starting 24 hours before your test.  Ask your health care provider, or the department that is doing the test, when your results will be ready. This information is not intended to replace advice given to you by your health care provider. Make sure you discuss any questions you have with your health care provider. Document Revised: 02/17/2017 Document Reviewed: 12/06/2016 Elsevier Patient Education  2020 Else[employer] Inc.   Fall Prevention in the Home, Adult Falls can cause injuries. They can happen to people of all ages. There are many things you can do to make your home safe and to help prevent falls. Ask for help when making these changes, if needed. What actions can I take to prevent falls? General Instructions  Use good lighting in all rooms. Replace any light bulbs that burn out.  Turn on the lights when you go into a dark   area. Use night-lights.  Keep items that you use often in easy-to-reach places. Lower the shelves around your home if necessary.  Set up your furniture so you have a clear path. Avoid moving your furniture around.  Do not have throw rugs and other things on the floor that can make you trip.  Avoid walking on wet floors.  If any of your floors are uneven, fix them.  Add color or contrast paint or tape to clearly mark and help you see: ? Any grab bars or  handrails. ? First and last steps of stairways. ? Where the edge of each step is.  If you use a stepladder: ? Make sure that it is fully opened. Do not climb a closed stepladder. ? Make sure that both sides of the stepladder are locked into place. ? Ask someone to hold the stepladder for you while you use it.  If there are any pets around you, be aware of where they are. What can I do in the bathroom?      Keep the floor dry. Clean up any water that spills onto the floor as soon as it happens.  Remove soap buildup in the tub or shower regularly.  Use non-skid mats or decals on the floor of the tub or shower.  Attach bath mats securely with double-sided, non-slip rug tape.  If you need to sit down in the shower, use a plastic, non-slip stool.  Install grab bars by the toilet and in the tub and shower. Do not use towel bars as grab bars. What can I do in the bedroom?  Make sure that you have a light by your bed that is easy to reach.  Do not use any sheets or blankets that are too big for your bed. They should not hang down onto the floor.  Have a firm chair that has side arms. You can use this for support while you get dressed. What can I do in the kitchen?  Clean up any spills right away.  If you need to reach something above you, use a strong step stool that has a grab bar.  Keep electrical cords out of the way.  Do not use floor polish or wax that makes floors slippery. If you must use wax, use non-skid floor wax. What can I do with my stairs?  Do not leave any items on the stairs.  Make sure that you have a light switch at the top of the stairs and the bottom of the stairs. If you do not have them, ask someone to add them for you.  Make sure that there are handrails on both sides of the stairs, and use them. Fix handrails that are broken or loose. Make sure that handrails are as long as the stairways.  Install non-slip stair treads on all stairs in your  home.  Avoid having throw rugs at the top or bottom of the stairs. If you do have throw rugs, attach them to the floor with carpet tape.  Choose a carpet that does not hide the edge of the steps on the stairway.  Check any carpeting to make sure that it is firmly attached to the stairs. Fix any carpet that is loose or worn. What can I do on the outside of my home?  Use bright outdoor lighting.  Regularly fix the edges of walkways and driveways and fix any cracks.  Remove anything that might make you trip as you walk through a door, such as a   raised step or threshold.  Trim any bushes or trees on the path to your home.  Regularly check to see if handrails are loose or broken. Make sure that both sides of any steps have handrails.  Install guardrails along the edges of any raised decks and porches.  Clear walking paths of anything that might make someone trip, such as tools or rocks.  Have any leaves, snow, or ice cleared regularly.  Use sand or salt on walking paths during winter.  Clean up any spills in your garage right away. This includes grease or oil spills. What other actions can I take?  Wear shoes that: ? Have a low heel. Do not wear high heels. ? Have rubber bottoms. ? Are comfortable and fit you well. ? Are closed at the toe. Do not wear open-toe sandals.  Use tools that help you move around (mobility aids) if they are needed. These include: ? Canes. ? Walkers. ? Scooters. ? Crutches.  Review your medicines with your doctor. Some medicines can make you feel dizzy. This can increase your chance of falling. Ask your doctor what other things you can do to help prevent falls. Where to find more information  Centers for Disease Control and Prevention, STEADI: https://garcia.biz/  [employer] on Aging: BrainJudge.co.uk Contact a doctor if:  You are afraid of falling at home.  You feel weak, drowsy, or dizzy at home.  You fall at  home. Summary  There are many simple things that you can do to make your home safe and to help prevent falls.  Ways to make your home safe include removing tripping hazards and installing grab bars in the bathroom.  Ask for help when making these changes in your home. This information is not intended to replace advice given to you by your health care provider. Make sure you discuss any questions you have with your health care provider. Document Revised: 05/25/2018 Document Reviewed: 09/16/2016 Elsevier Patient Education  2020 [place] Maintenance, Female Adopting a healthy lifestyle and getting preventive care are important in promoting health and wellness. Ask your health care provider about:  The right schedule for you to have regular tests and exams.  Things you can do on your own to prevent diseases and keep yourself healthy. What should I know about diet, weight, and exercise? Eat a healthy diet   Eat a diet that includes plenty of vegetables, fruits, low-fat dairy products, and lean protein.  Do not eat a lot of foods that are high in solid fats, added sugars, or sodium. Maintain a healthy weight Body mass index (BMI) is used to identify weight problems. It estimates body fat based on height and weight. Your health care provider can help determine your BMI and help you achieve or maintain a healthy weight. Get regular exercise Get regular exercise. This is one of the most important things you can do for your health. Most adults should:  Exercise for at least 150 minutes each week. The exercise should increase your heart rate and make you sweat (moderate-intensity exercise).  Do strengthening exercises at least twice a week. This is in addition to the moderate-intensity exercise.  Spend less time sitting. Even light physical activity can be beneficial. Watch cholesterol and blood lipids Have your blood tested for lipids and cholesterol at 69 years of age, then  have this test every 5 years. Have your cholesterol levels checked more often if:  Your lipid or cholesterol levels are high.  You are  older than 69 years of age.  You are at high risk for heart disease. What should I know about cancer screening? Depending on your health history and family history, you may need to have cancer screening at various ages. This may include screening for:  Breast cancer.  Cervical cancer.  Colorectal cancer.  Skin cancer.  Lung cancer. What should I know about heart disease, diabetes, and high blood pressure? Blood pressure and heart disease  High blood pressure causes heart disease and increases the risk of stroke. This is more likely to develop in people who have high blood pressure readings, are of African descent, or are overweight.  Have your blood pressure checked: ? Every 3-5 years if you are 18-39 years of age. ? Every year if you are 40 years old or older. Diabetes Have regular diabetes screenings. This checks your fasting blood sugar level. Have the screening done:  Once every three years after age 40 if you are at a normal weight and have a low risk for diabetes.  More often and at a younger age if you are overweight or have a high risk for diabetes. What should I know about preventing infection? Hepatitis B If you have a higher risk for hepatitis B, you should be screened for this virus. Talk with your health care provider to find out if you are at risk for hepatitis B infection. Hepatitis C Testing is recommended for:  Everyone born from 1945 through 1965.  Anyone with known risk factors for hepatitis C. Sexually transmitted infections (STIs)  Get screened for STIs, including gonorrhea and chlamydia, if: ? You are sexually active and are younger than 69 years of age. ? You are older than 69 years of age and your health care provider tells you that you are at risk for this type of infection. ? Your sexual activity has changed  since you were last screened, and you are at increased risk for chlamydia or gonorrhea. Ask your health care provider if you are at risk.  Ask your health care provider about whether you are at high risk for HIV. Your health care provider may recommend a prescription medicine to help prevent HIV infection. If you choose to take medicine to prevent HIV, you should first get tested for HIV. You should then be tested every 3 months for as long as you are taking the medicine. Pregnancy  If you are about to stop having your period (premenopausal) and you may become pregnant, seek counseling before you get pregnant.  Take 400 to 800 micrograms (mcg) of folic acid every day if you become pregnant.  Ask for birth control (contraception) if you want to prevent pregnancy. Osteoporosis and menopause Osteoporosis is a disease in which the bones lose minerals and strength with aging. This can result in bone fractures. If you are 65 years old or older, or if you are at risk for osteoporosis and fractures, ask your health care provider if you should:  Be screened for bone loss.  Take a calcium or vitamin D supplement to lower your risk of fractures.  Be given hormone replacement therapy (HRT) to treat symptoms of menopause. Follow these instructions at home: Lifestyle  Do not use any products that contain nicotine or tobacco, such as cigarettes, e-cigarettes, and chewing tobacco. If you need help quitting, ask your health care provider.  Do not use street drugs.  Do not share needles.  Ask your health care provider for help if you need support or information about   quitting drugs. Alcohol use  Do not drink alcohol if: ? Your health care provider tells you not to drink. ? You are pregnant, may be pregnant, or are planning to become pregnant.  If you drink alcohol: ? Limit how much you use to 0-1 drink a day. ? Limit intake if you are breastfeeding.  Be aware of how much alcohol is in your drink.  In the [place] one drink equals one 12 oz bottle of beer (355 mL), one 5 oz glass of wine (148 mL), or one 1 oz glass of hard liquor (44 mL). General instructions  Schedule regular health, dental, and eye exams.  Stay current with your vaccines.  Tell your health care provider if: ? You often feel depressed. ? You have ever been abused or do not feel safe at home. Summary  Adopting a healthy lifestyle and getting preventive care are important in promoting health and wellness.  Follow your health care provider's instructions about healthy diet, exercising, and getting tested or screened for diseases.  Follow your health care provider's instructions on monitoring your cholesterol and blood pressure. This information is not intended to replace advice given to you by your health care provider. Make sure you discuss any questions you have with your health care provider. Document Revised: 01/25/2018 Document Reviewed: 01/25/2018 Elsevier Patient Education  2020 [employer].  Thank you for enrolling in [facility]. Please follow the instructions below to securely access your online medical record. MyChart allows you to send messages to your doctor, view your test results, manage appointments, and more.   How Do I Sign Up? 1. In your Internet browser, go to [employer] and enter [email or web address]. 2. Click on the Sign Up Now link in the Sign In box. You will see the New Member Sign Up page. 3. Enter your MyChart Access Code exactly as it appears below. You will not need to use this code after you've completed the sign-up process. If you do not sign up before the expiration date, you must request a new code.  MyChart Access Code: [email or web address] Expires: 05/28/2019 10:00 AM  4. Enter your Social Security Number ([email or web address]) and Date of Birth (mm/dd/yyyy) as indicated and click Submit. You will be taken to the next sign-up page. 5. Create a MyChart ID. This will be your MyChart  login ID and cannot be changed, so think of one that is secure and easy to remember. 6. Create a MyChart password. You can change your password at any time. 7. Enter your Password Reset Question and Answer. This can be used at a later time if you forget your password.  8. Enter your e-mail address. You will receive e-mail notification when new information is available in MyChart. 9. Click Sign Up. You can now view your medical record.   Additional Information Remember, MyChart is NOT to be used for urgent needs. For medical emergencies, dial 911.

## 2019-04-13 NOTE — Progress Notes (Signed)
Subjective:   Megan Aguilar is a 69 y.o. female who presents for Medicare Annual (Subsequent) preventive examination.  This wellness visit is conducted by a nurse.  The patient's medications were reviewed and reconciled since the patient's last visit.  History details were provided by the patient.  The history appears to be reliable.    Patient's last AWV was 1 year ago.   Medical History: Patient history and Family history was reviewed  Medications, Allergies, and preventative health maintenance was reviewed and updated.  Review of Systems:  Review of Systems  Constitutional: Negative.   HENT: Negative.   Respiratory: Negative.  Negative for chest tightness and shortness of breath.   Cardiovascular: Positive for leg swelling. Negative for chest pain and palpitations.  Gastrointestinal: Negative.   Endocrine: Negative.   Genitourinary: Negative.   Musculoskeletal: Positive for arthralgias. Negative for gait problem and myalgias.  Neurological: Negative.   Psychiatric/Behavioral: Positive for sleep disturbance. Negative for confusion and suicidal ideas. The patient is not nervous/anxious.    Cardiac Risk Factors include: advanced age (>69men, >49 women);diabetes mellitus;hypertension;obesity (BMI >30kg/m2)     Objective:     Vitals: BP 122/78 (BP Location: Left Arm, Patient Position: Sitting, Cuff Size: Large)   Pulse 79   Temp 97.9 F (36.6 C)   Resp 16   Ht 5\' 3"  (1.6 m)   Wt 207 lb 9.6 oz (94.2 kg)   SpO2 96%   BMI 36.77 kg/m   Body mass index is 36.77 kg/m.  Advanced Directives 04/13/2019 02/11/2019 02/24/2018 02/24/2018 02/20/2018  Does Patient Have a Medical Advance Directive? Yes No Yes Yes Yes  Type of 04/21/2018 of [place];Living will - Healthcare Power of [place];Living will Healthcare Power of [place];Living will -  Does patient want to make changes to medical advance directive? No - Patient declined - No - Patient declined - -  Copy of  Healthcare Power of Attorney in Chart? No - copy requested - No - copy requested No - copy requested -  Would patient like information on creating a medical advance directive? - No - Patient declined - - -    Tobacco Social History   Tobacco Use  Smoking Status Former Smoker  . Packs/day: 2.00  . Years: 30.00  . Pack years: 60.00  . Types: Cigarettes  . Quit date: 02/21/2004  . Years since quitting: 15.1  Smokeless Tobacco Never Used   Clinical Intake:  Pre-visit preparation completed: Yes  Pain : No/denies pain Pain Score: 0-No pain     BMI - recorded: 36.77 Nutritional Status: BMI > 30  Obese Nutritional Risks: None Diabetes: Yes CBG done?: No Did pt. bring in CBG monitor from home?: No  How often do you need to have someone help you when you read instructions, pamphlets, or other written materials from your doctor or pharmacy?: 1 - Never  Interpreter Needed?: No     Past Medical History:  Diagnosis Date  . Adult acne   . Arthritis    "all over" (02/24/2018)  . Chronic bronchitis (HCC)   . GERD (gastroesophageal reflux disease)   . Headache    "at least 1/wk" (02/24/2018)  . Heart murmur    AS A 69 YR OLD...HASN'T BEEN A PROBLEM SINCE THEN.   . High cholesterol   . Hypertension   . Hypothyroidism    Past Surgical History:  Procedure Laterality Date  . ANTERIOR / POSTERIOR COMBINED FUSION CERVICAL SPINE  02/24/2018   C3-4; C7-T1  .  ANTERIOR CERVICAL DECOMP/DISCECTOMY FUSION  2006  . ANTERIOR CERVICAL DECOMP/DISCECTOMY FUSION N/A 02/24/2018   Procedure: Anterior Cervical Decompression Fusion  - Cervical three-Cervical four;  Surgeon: Eustace Moore, MD;  Location: [facility];  Service: Neurosurgery;  Laterality: N/A;  . BACK SURGERY    . JOINT REPLACEMENT    . LAPAROSCOPIC CHOLECYSTECTOMY    . LEFT HEART CATH AND CORONARY ANGIOGRAPHY N/A 02/12/2019   Procedure: LEFT HEART CATH AND CORONARY ANGIOGRAPHY;  Surgeon: [name], MD;  Location: [facility];   Service: Cardiovascular;  Laterality: N/A;  . POSTERIOR CERVICAL FUSION/FORAMINOTOMY N/A 02/24/2018   Procedure: Posterior cervical decompression and lateral mass fusion Cervical seven -Thoracic one;  Surgeon: Eustace Moore, MD;  Location: [facility];  Service: Neurosurgery;  Laterality: N/A;  . TOTAL ABDOMINAL HYSTERECTOMY  2017  . TOTAL HIP ARTHROPLASTY Right 07/2017  . TUBAL LIGATION  1977   Family History  Problem Relation Age of Onset  . Diabetes Mother   . COPD Father   . Aortic stenosis Father   . Alzheimer's disease Father   . Congestive Heart Failure Father   . Diabetes Sister   . Lung cancer Brother   . Hyperlipidemia Brother   . Hypertension Brother    Social History   Socioeconomic History  . Marital status: Married    Spouse name: Not on file  . Number of children: 2  . Years of education: Not on file  . Highest education level: Not on file  Occupational History  . Not on file  Tobacco Use  . Smoking status: Former Smoker    Packs/day: 2.00    Years: 30.00    Pack years: 60.00    Types: Cigarettes    Quit date: 02/21/2004    Years since quitting: 15.1  . Smokeless tobacco: Never Used  Substance and Sexual Activity  . Alcohol use: Never  . Drug use: Never  . Sexual activity: Not Currently  Other Topics Concern  . Not on file  Social History Narrative  . Not on file    Outpatient Encounter Medications as of 04/13/2019  Medication Sig  . amLODipine (NORVASC) 5 MG tablet Take 1 tablet (5 mg total) by mouth daily.  Marland Kitchen aspirin 81 MG EC tablet Take 81 mg by mouth daily.   . B Complex-C (B-COMPLEX WITH VITAMIN C) tablet Take 1 tablet by mouth daily.  Jolyne Loa Grape-Goldenseal (BERBERINE COMPLEX PO) Take 1 capsule by mouth daily.  . Calcium Carb-Cholecalciferol (CALCIUM 600+D) 600-800 MG-UNIT TABS Take 1 tablet by mouth daily.  . clopidogrel (PLAVIX) 75 MG tablet Take 1 tablet (75 mg total) by mouth daily.  . Coenzyme Q10 (CO Q-10) 200 MG CAPS Take 200 mg by  mouth daily.  . Cyanocobalamin (VITAMIN B-12 PO) Take 5,000 mcg by mouth daily.  . fexofenadine (ALLEGRA) 180 MG tablet Take 180 mg by mouth at bedtime.  . fluticasone (FLONASE) 50 MCG/ACT nasal spray Place 2 sprays into both nostrils daily as needed for allergies or rhinitis.  Marland Kitchen FREESTYLE LITE test strip USE TO TEST SUGAR DAILY  . gabapentin (NEURONTIN) 300 MG capsule Take 300 mg by mouth daily.   . Glucosamine-Chondroit-Vit C-Mn (GLUCOSAMINE 1500 COMPLEX PO) Take 3,000 mg by mouth daily.  . isosorbide mononitrate (IMDUR) 30 MG 24 hr tablet Take 30 mg by mouth every morning.  Marland Kitchen ketoconazole (NIZORAL) 2 % shampoo Apply 1 application topically See admin instructions. 2 to 3 times a week  . levothyroxine (SYNTHROID, LEVOTHROID) 50 MCG  tablet Take 50 mcg by mouth daily before breakfast.  . magnesium oxide (MAG-OX) 400 MG tablet Take 800 mg by mouth daily.  . Melatonin 5 MG TABS Take 5 mg by mouth at bedtime.  . metFORMIN (GLUCOPHAGE) 500 MG tablet Take 1 tablet (500 mg total) by mouth daily.  . mupirocin ointment (BACTROBAN) 2 % PLEASE SEE ATTACHED FOR DETAILED DIRECTIONS  . nitroGLYCERIN (NITROSTAT) 0.4 MG SL tablet Place 1 tablet (0.4 mg total) under the tongue every 5 (five) minutes as needed for chest pain.  Marland Kitchen nystatin cream (MYCOSTATIN) Apply 1 application topically 2 (two) times daily. Mix with triamcinolone and apply to face  . olmesartan (BENICAR) 20 MG tablet TAKE 1 TABLET BY MOUTH EVERY DAY  . Omega-3 Fatty Acids (FISH OIL) 1200 MG CAPS Take 1,200 mg by mouth daily.  . RABEprazole (ACIPHEX) 20 MG tablet Take 20 mg by mouth daily.  . rosuvastatin (CRESTOR) 20 MG tablet Take 1 tablet (20 mg total) by mouth daily.  Marland Kitchen tiZANidine (ZANAFLEX) 4 MG tablet Take 4 mg by mouth at bedtime.   . triamcinolone cream (KENALOG) 0.1 % Apply 1 application topically 2 (two) times daily. Mix with nystatin and apply to face  . TURMERIC PO Take 538 mg by mouth daily.   No facility-administered encounter  medications on file as of 04/13/2019.    Activities of Daily Living In your present state of health, do you have any difficulty performing the following activities: 04/13/2019 02/11/2019  Hearing? N N  Vision? N N  Difficulty concentrating or making decisions? N N  Walking or climbing stairs? N Y  Dressing or bathing? N N  Doing errands, shopping? N N  Preparing Food and eating ? N -  Using the Toilet? N -  In the past six months, have you accidently leaked urine? Y -  Do you have problems with loss of bowel control? N -  Managing your Medications? N -  Managing your Finances? N -  Housekeeping or managing your Housekeeping? N -  Some recent data might be hidden    Patient Care Team: Blane Ohara, MD as PCP - General (Family Medicine)    Assessment:   This is a routine wellness examination for Tosca.  Exercise Activities and Dietary recommendations Current Exercise Habits: Structured exercise class, Type of exercise: treadmill;walking;strength training/weights;Other - see comments(Currently participates in Cardiac Rehab Therapy), Time (Minutes): 30, Frequency (Times/Week): 6, Weekly Exercise (Minutes/Week): 180, Intensity: Moderate, Exercise limited by: cardiac condition(s)  Goals   None     Fall Risk Fall Risk  04/13/2019  Falls in the past year? 0  Risk for fall due to : No Fall Risks   Is the patient's home free of loose throw rugs in walkways, pet beds, electrical cords, etc?   yes      Grab bars in the bathroom? no      Handrails on the stairs?   yes      Adequate lighting?   yes   Depression Screen PHQ 2/9 Scores 04/13/2019  PHQ - 2 Score 0     Cognitive Function     6CIT Screen 04/13/2019  What Year? 0 points  What month? 0 points  What time? 0 points  Count back from 20 0 points  Months in reverse 0 points  Repeat phrase 0 points  Total Score 0    Immunization History  Administered Date(s) Administered  . Influenza-Unspecified 11/10/2018  .  Pneumococcal Conjugate-13 03/25/2014  . Pneumococcal Polysaccharide-23 05/07/2011, 04/13/2019  .  Tdap 05/02/2012  . Zoster 07/16/2011    Screening Tests Health Maintenance  Topic Date Due  . Hepatitis C Screening  10/10/1950  . DEXA SCAN  05/07/2019  . MAMMOGRAM  12/01/2019  . COLONOSCOPY  08/02/2021  . TETANUS/TDAP  05/03/2022  . INFLUENZA VACCINE  Completed  . PNA vac Low Risk Adult  Completed    Cancer Screenings: Lung: Low Dose CT Chest recommended if Age 9-80 years, 30 pack-year currently smoking OR have quit w/in 15years. Patient does not qualify. Breast:  Up to date on Mammogram? Yes   Up to date of Bone Density/Dexa? No Colorectal: Last colonoscopy done 08/02/16     Plan:  Patient will receive Pneumovax 23 today to complete her pneumonia vaccines.  She states that she has received her COVID-19 vaccine series.  She is up to date with her influenza vaccine and will get the 2021/2022 vaccine in the fall.  We will schedule a DEXA scan, she will be due within the next month.  She will continue to exercise and eat a healthy diabetic low sodium diet.    Patient will keep up with yearly dental and ophthalmology appointments.  I have personally reviewed and noted the following in the patient's chart:   . Medical and social history . Use of alcohol, tobacco or illicit drugs  . Current medications and supplements . Functional ability and status . Nutritional status . Physical activity . Advanced directives . List of other physicians . Hospitalizations, surgeries, and ER visits in previous 12 months . Vitals . Screenings to include cognitive, depression, and falls . Referrals and appointments  In addition, I have reviewed and discussed with patient certain preventive protocols, quality metrics, and best practice recommendations. A written personalized care plan for preventive services as well as general preventive health recommendations were provided to patient.      [name], LPN  [phone]

## 2019-04-16 DIAGNOSIS — E039 Hypothyroidism, unspecified: Secondary | ICD-10-CM | POA: Diagnosis not present

## 2019-04-16 DIAGNOSIS — K219 Gastro-esophageal reflux disease without esophagitis: Secondary | ICD-10-CM | POA: Diagnosis not present

## 2019-04-16 DIAGNOSIS — E7800 Pure hypercholesterolemia, unspecified: Secondary | ICD-10-CM | POA: Diagnosis not present

## 2019-04-16 DIAGNOSIS — I252 Old myocardial infarction: Secondary | ICD-10-CM | POA: Diagnosis not present

## 2019-04-16 DIAGNOSIS — I214 Non-ST elevation (NSTEMI) myocardial infarction: Secondary | ICD-10-CM | POA: Diagnosis not present

## 2019-04-16 DIAGNOSIS — R011 Cardiac murmur, unspecified: Secondary | ICD-10-CM | POA: Diagnosis not present

## 2019-04-18 DIAGNOSIS — I252 Old myocardial infarction: Secondary | ICD-10-CM | POA: Diagnosis not present

## 2019-04-18 DIAGNOSIS — E039 Hypothyroidism, unspecified: Secondary | ICD-10-CM | POA: Diagnosis not present

## 2019-04-18 DIAGNOSIS — K219 Gastro-esophageal reflux disease without esophagitis: Secondary | ICD-10-CM | POA: Diagnosis not present

## 2019-04-18 DIAGNOSIS — R011 Cardiac murmur, unspecified: Secondary | ICD-10-CM | POA: Diagnosis not present

## 2019-04-18 DIAGNOSIS — I214 Non-ST elevation (NSTEMI) myocardial infarction: Secondary | ICD-10-CM | POA: Diagnosis not present

## 2019-04-18 DIAGNOSIS — E7800 Pure hypercholesterolemia, unspecified: Secondary | ICD-10-CM | POA: Diagnosis not present

## 2019-04-19 ENCOUNTER — Other Ambulatory Visit: Payer: Self-pay | Admitting: Cardiology

## 2019-04-20 DIAGNOSIS — I214 Non-ST elevation (NSTEMI) myocardial infarction: Secondary | ICD-10-CM | POA: Diagnosis not present

## 2019-04-20 DIAGNOSIS — K219 Gastro-esophageal reflux disease without esophagitis: Secondary | ICD-10-CM | POA: Diagnosis not present

## 2019-04-20 DIAGNOSIS — R011 Cardiac murmur, unspecified: Secondary | ICD-10-CM | POA: Diagnosis not present

## 2019-04-20 DIAGNOSIS — E039 Hypothyroidism, unspecified: Secondary | ICD-10-CM | POA: Diagnosis not present

## 2019-04-20 DIAGNOSIS — E7800 Pure hypercholesterolemia, unspecified: Secondary | ICD-10-CM | POA: Diagnosis not present

## 2019-04-20 DIAGNOSIS — I252 Old myocardial infarction: Secondary | ICD-10-CM | POA: Diagnosis not present

## 2019-04-23 DIAGNOSIS — I252 Old myocardial infarction: Secondary | ICD-10-CM | POA: Diagnosis not present

## 2019-04-23 DIAGNOSIS — R011 Cardiac murmur, unspecified: Secondary | ICD-10-CM | POA: Diagnosis not present

## 2019-04-23 DIAGNOSIS — E039 Hypothyroidism, unspecified: Secondary | ICD-10-CM | POA: Diagnosis not present

## 2019-04-23 DIAGNOSIS — K219 Gastro-esophageal reflux disease without esophagitis: Secondary | ICD-10-CM | POA: Diagnosis not present

## 2019-04-23 DIAGNOSIS — I214 Non-ST elevation (NSTEMI) myocardial infarction: Secondary | ICD-10-CM | POA: Diagnosis not present

## 2019-04-23 DIAGNOSIS — E7800 Pure hypercholesterolemia, unspecified: Secondary | ICD-10-CM | POA: Diagnosis not present

## 2019-04-25 DIAGNOSIS — R011 Cardiac murmur, unspecified: Secondary | ICD-10-CM | POA: Diagnosis not present

## 2019-04-25 DIAGNOSIS — I214 Non-ST elevation (NSTEMI) myocardial infarction: Secondary | ICD-10-CM | POA: Diagnosis not present

## 2019-04-25 DIAGNOSIS — E7800 Pure hypercholesterolemia, unspecified: Secondary | ICD-10-CM | POA: Diagnosis not present

## 2019-04-25 DIAGNOSIS — K219 Gastro-esophageal reflux disease without esophagitis: Secondary | ICD-10-CM | POA: Diagnosis not present

## 2019-04-25 DIAGNOSIS — E039 Hypothyroidism, unspecified: Secondary | ICD-10-CM | POA: Diagnosis not present

## 2019-04-25 DIAGNOSIS — I252 Old myocardial infarction: Secondary | ICD-10-CM | POA: Diagnosis not present

## 2019-04-26 IMAGING — RF DG CERVICAL SPINE 2 OR 3 VIEWS
1 series · 5 of 5 positions shown · non-contrast
Comparison: MRI dated 09/14/2017

CLINICAL DATA: Cervical spinal stenosis. Disc protrusion.

EXAM:
DG C-ARM 61-120 MIN; CERVICAL SPINE - 2-3 VIEW

[Series 1: run · 5 of 5 slices shown]
[im 1/5]
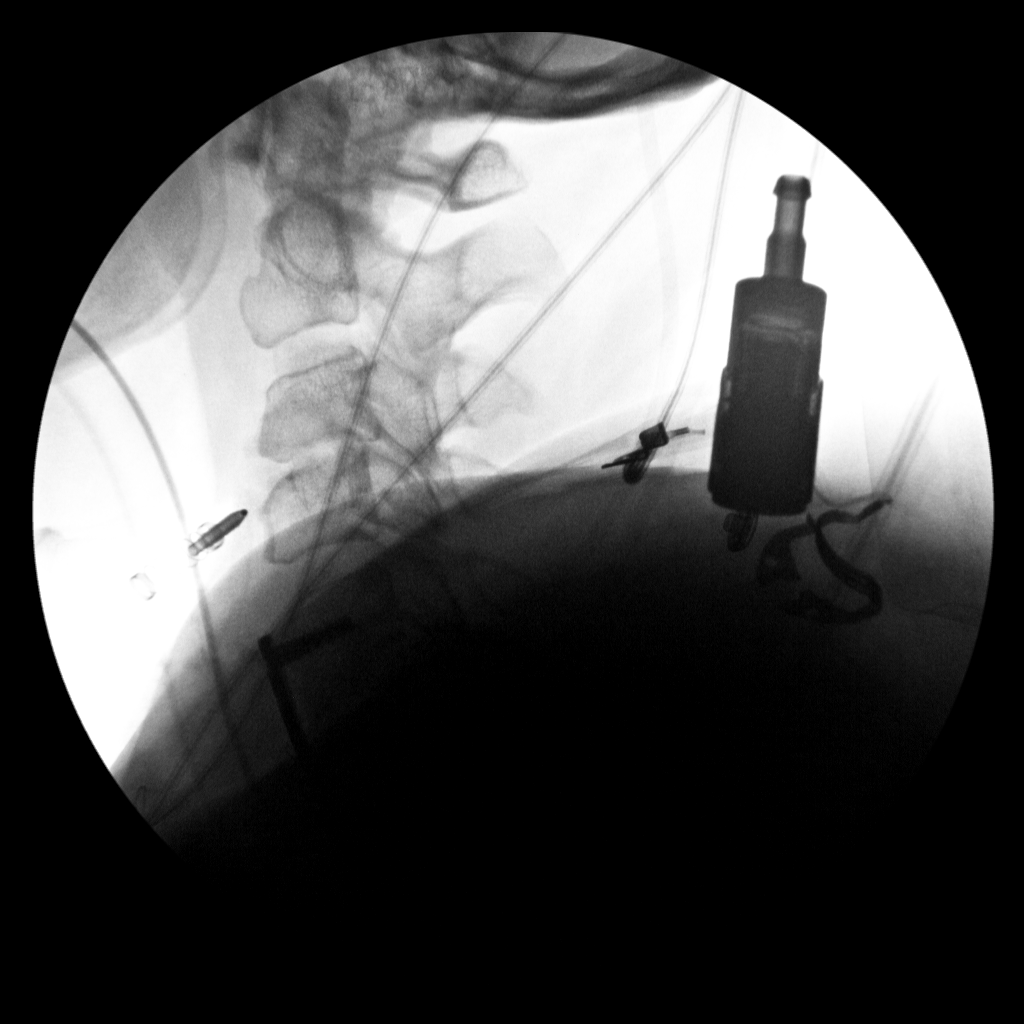
[im 2/5]
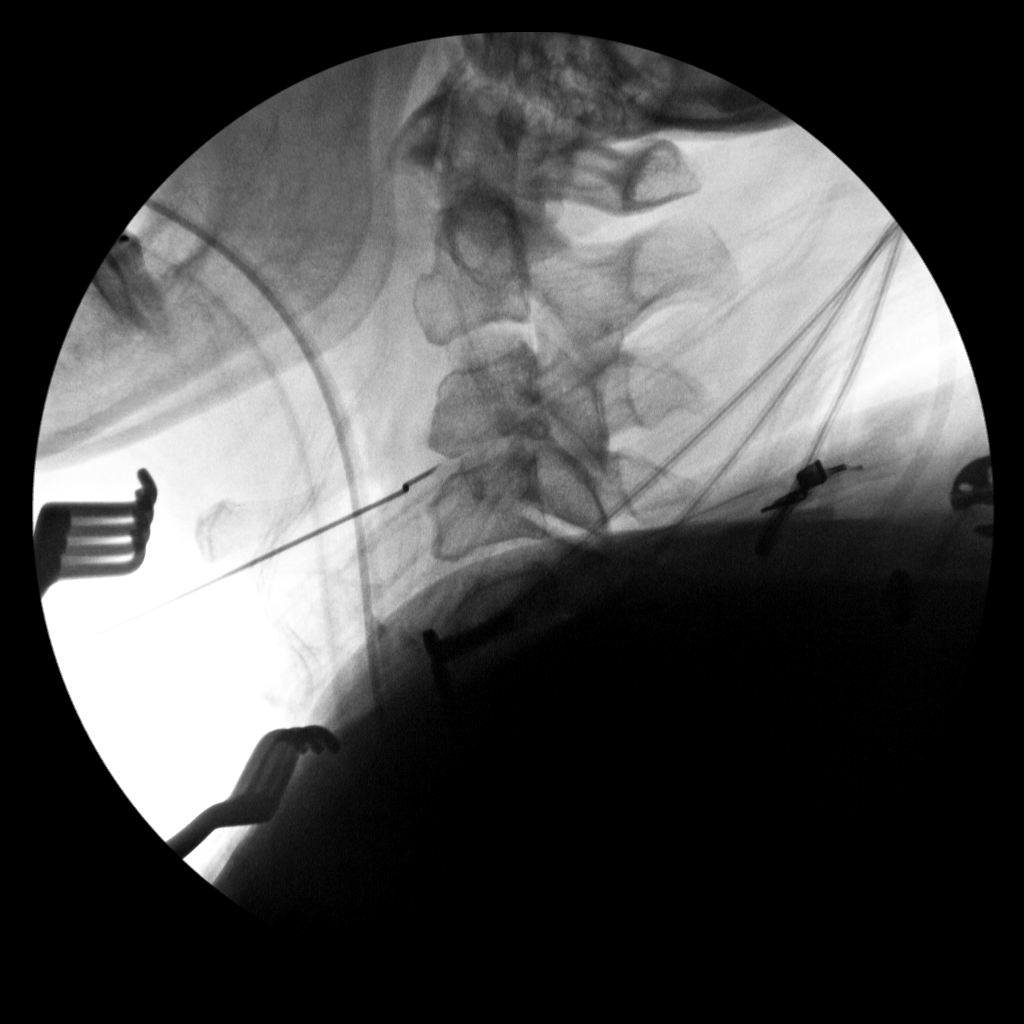
[im 3/5]
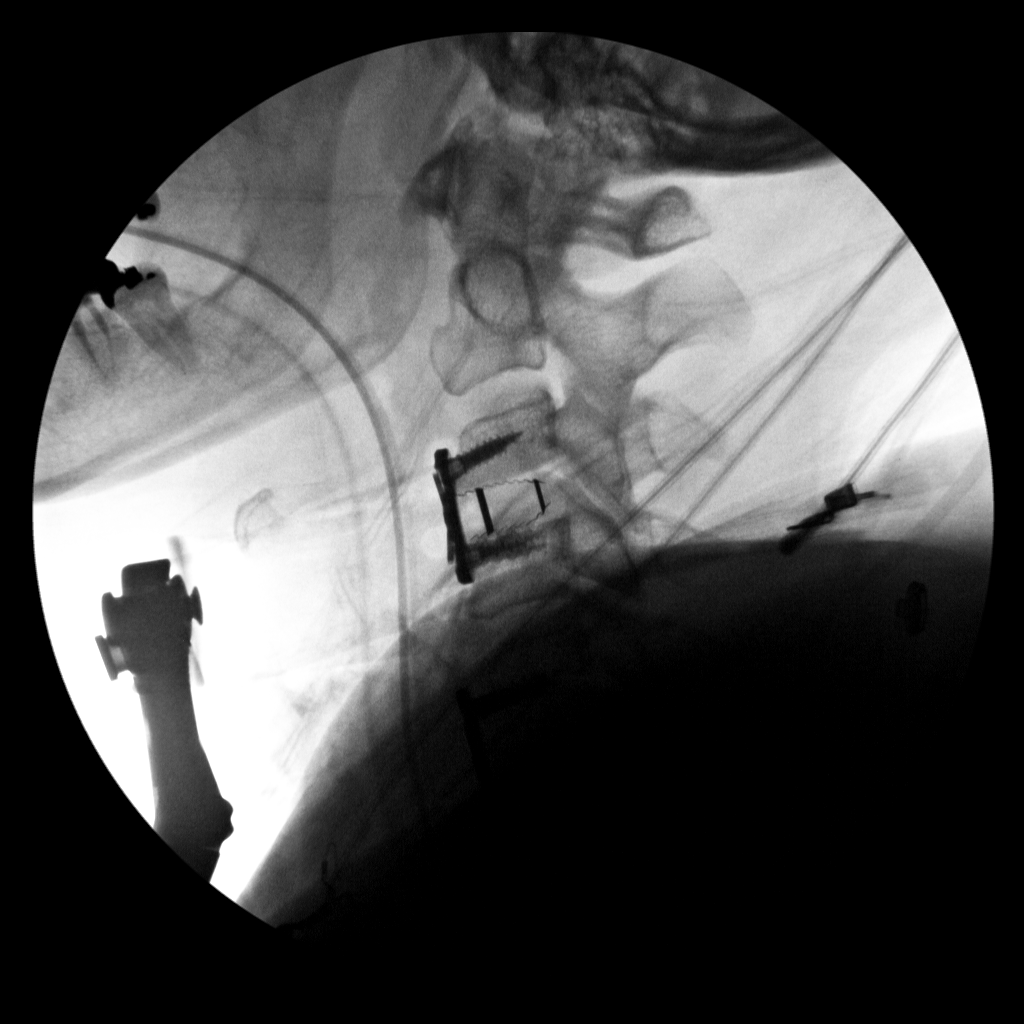
[im 4/5]
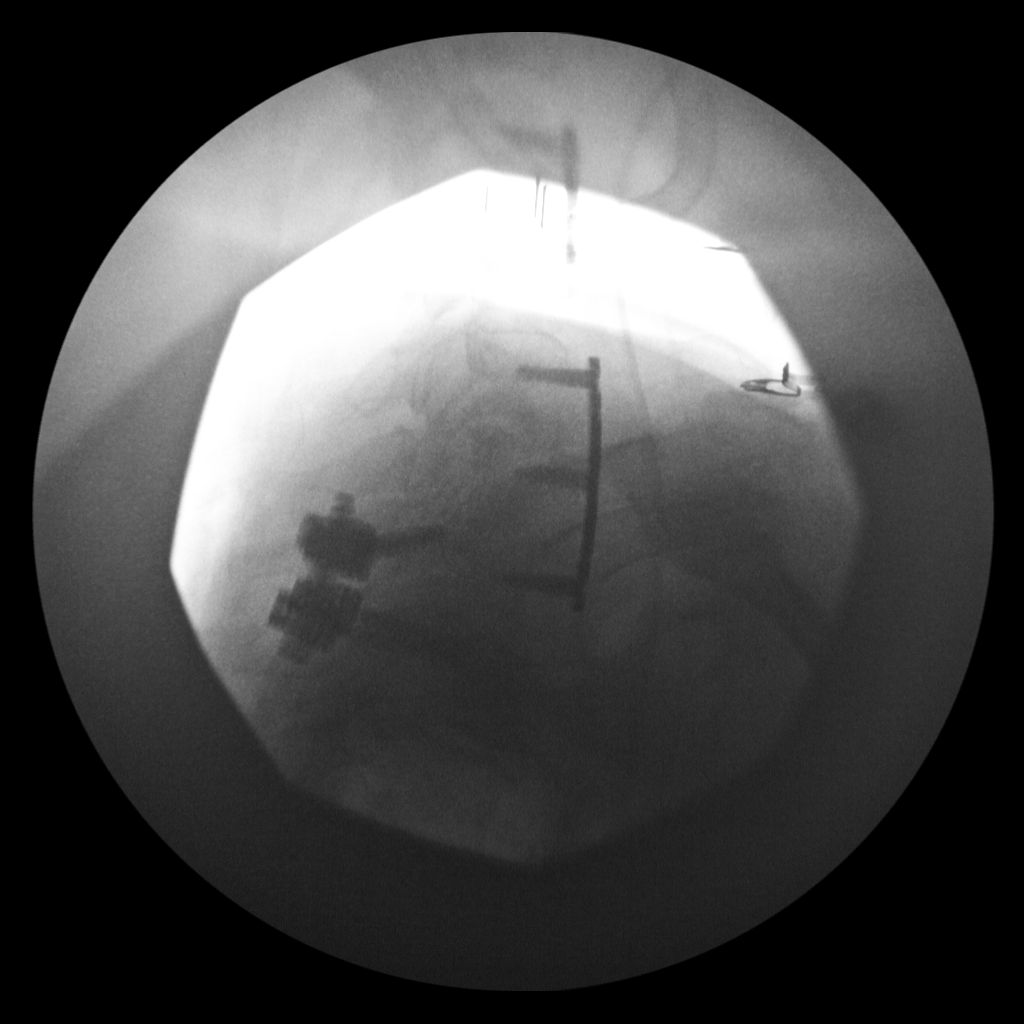
[im 5/5]
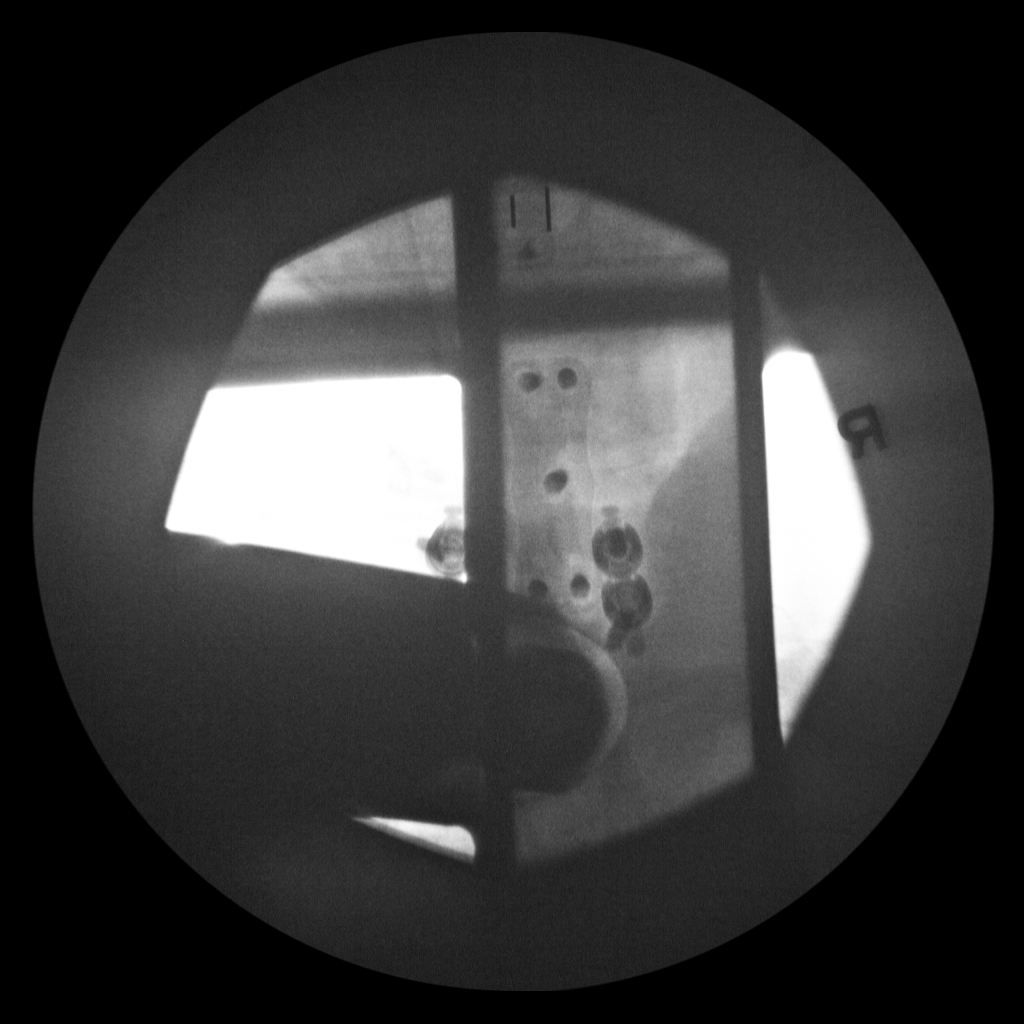

[5 of 5 positions shown; findings below may reference images not displayed]

FINDINGS: Lateral C-arm images demonstrate the patient has undergone anterior
cervical fusion at C3-4. The hardware appears in good position with
normal alignment. The patient has also undergone posterior fusion at
C7-T1.
IMPRESSION: Anterior cervical fusion performed at C3-4. Posterior fusion
performed at C7-T1.

## 2019-04-27 DIAGNOSIS — K219 Gastro-esophageal reflux disease without esophagitis: Secondary | ICD-10-CM | POA: Diagnosis not present

## 2019-04-27 DIAGNOSIS — E7800 Pure hypercholesterolemia, unspecified: Secondary | ICD-10-CM | POA: Diagnosis not present

## 2019-04-27 DIAGNOSIS — E039 Hypothyroidism, unspecified: Secondary | ICD-10-CM | POA: Diagnosis not present

## 2019-04-27 DIAGNOSIS — R011 Cardiac murmur, unspecified: Secondary | ICD-10-CM | POA: Diagnosis not present

## 2019-04-27 DIAGNOSIS — I214 Non-ST elevation (NSTEMI) myocardial infarction: Secondary | ICD-10-CM | POA: Diagnosis not present

## 2019-04-27 DIAGNOSIS — I252 Old myocardial infarction: Secondary | ICD-10-CM | POA: Diagnosis not present

## 2019-04-30 DIAGNOSIS — K219 Gastro-esophageal reflux disease without esophagitis: Secondary | ICD-10-CM | POA: Diagnosis not present

## 2019-04-30 DIAGNOSIS — I214 Non-ST elevation (NSTEMI) myocardial infarction: Secondary | ICD-10-CM | POA: Diagnosis not present

## 2019-04-30 DIAGNOSIS — R011 Cardiac murmur, unspecified: Secondary | ICD-10-CM | POA: Diagnosis not present

## 2019-04-30 DIAGNOSIS — E7800 Pure hypercholesterolemia, unspecified: Secondary | ICD-10-CM | POA: Diagnosis not present

## 2019-04-30 DIAGNOSIS — I252 Old myocardial infarction: Secondary | ICD-10-CM | POA: Diagnosis not present

## 2019-04-30 DIAGNOSIS — E039 Hypothyroidism, unspecified: Secondary | ICD-10-CM | POA: Diagnosis not present

## 2019-05-01 ENCOUNTER — Other Ambulatory Visit: Payer: Self-pay

## 2019-05-01 DIAGNOSIS — Z8673 Personal history of transient ischemic attack (TIA), and cerebral infarction without residual deficits: Secondary | ICD-10-CM

## 2019-05-01 DIAGNOSIS — I10 Essential (primary) hypertension: Secondary | ICD-10-CM

## 2019-05-01 MED ORDER — CLOPIDOGREL BISULFATE 75 MG PO TABS: 75.0000 mg | ORAL_TABLET | Freq: Every day | ORAL | 2 refills | Status: DC

## 2019-05-01 MED ORDER — AMLODIPINE BESYLATE 5 MG PO TABS: 5.0000 mg | ORAL_TABLET | Freq: Every day | ORAL | 2 refills | Status: DC

## 2019-05-01 NOTE — Telephone Encounter (Signed)
Patient called requesting a medication refill.  1) Medication(s) Requested (by name): Plavix 75mg  and Amlodipine 5mg   2) Pharmacy of Choice: [facility]     Approved medications will be sent to the pharmacy, we will reach out if there is an issue.  Requests made after 3pm may not be addressed until the following business day!  If a patient is unsure of the name of the medication(s) please note and ask patient to call back when they are able to provide all info, do not send to responsible party until all information is available!

## 2019-05-02 DIAGNOSIS — R011 Cardiac murmur, unspecified: Secondary | ICD-10-CM | POA: Diagnosis not present

## 2019-05-02 DIAGNOSIS — I252 Old myocardial infarction: Secondary | ICD-10-CM | POA: Diagnosis not present

## 2019-05-02 DIAGNOSIS — I214 Non-ST elevation (NSTEMI) myocardial infarction: Secondary | ICD-10-CM | POA: Diagnosis not present

## 2019-05-02 DIAGNOSIS — K219 Gastro-esophageal reflux disease without esophagitis: Secondary | ICD-10-CM | POA: Diagnosis not present

## 2019-05-02 DIAGNOSIS — E7800 Pure hypercholesterolemia, unspecified: Secondary | ICD-10-CM | POA: Diagnosis not present

## 2019-05-02 DIAGNOSIS — E039 Hypothyroidism, unspecified: Secondary | ICD-10-CM | POA: Diagnosis not present

## 2019-05-04 DIAGNOSIS — E039 Hypothyroidism, unspecified: Secondary | ICD-10-CM | POA: Diagnosis not present

## 2019-05-04 DIAGNOSIS — I214 Non-ST elevation (NSTEMI) myocardial infarction: Secondary | ICD-10-CM | POA: Diagnosis not present

## 2019-05-04 DIAGNOSIS — R011 Cardiac murmur, unspecified: Secondary | ICD-10-CM | POA: Diagnosis not present

## 2019-05-04 DIAGNOSIS — I252 Old myocardial infarction: Secondary | ICD-10-CM | POA: Diagnosis not present

## 2019-05-04 DIAGNOSIS — K219 Gastro-esophageal reflux disease without esophagitis: Secondary | ICD-10-CM | POA: Diagnosis not present

## 2019-05-04 DIAGNOSIS — E7800 Pure hypercholesterolemia, unspecified: Secondary | ICD-10-CM | POA: Diagnosis not present

## 2019-05-07 ENCOUNTER — Other Ambulatory Visit: Payer: Self-pay

## 2019-05-07 DIAGNOSIS — K219 Gastro-esophageal reflux disease without esophagitis: Secondary | ICD-10-CM | POA: Diagnosis not present

## 2019-05-07 DIAGNOSIS — J301 Allergic rhinitis due to pollen: Secondary | ICD-10-CM

## 2019-05-07 DIAGNOSIS — I214 Non-ST elevation (NSTEMI) myocardial infarction: Secondary | ICD-10-CM | POA: Diagnosis not present

## 2019-05-07 DIAGNOSIS — E039 Hypothyroidism, unspecified: Secondary | ICD-10-CM | POA: Diagnosis not present

## 2019-05-07 DIAGNOSIS — E7800 Pure hypercholesterolemia, unspecified: Secondary | ICD-10-CM | POA: Diagnosis not present

## 2019-05-07 DIAGNOSIS — I252 Old myocardial infarction: Secondary | ICD-10-CM | POA: Diagnosis not present

## 2019-05-07 DIAGNOSIS — R011 Cardiac murmur, unspecified: Secondary | ICD-10-CM | POA: Diagnosis not present

## 2019-05-07 MED ORDER — FLUTICASONE PROPIONATE 50 MCG/ACT NA SUSP: 2.0000 | Freq: Every day | NASAL | 4 refills | Status: DC | PRN

## 2019-05-09 DIAGNOSIS — E039 Hypothyroidism, unspecified: Secondary | ICD-10-CM | POA: Diagnosis not present

## 2019-05-09 DIAGNOSIS — E7800 Pure hypercholesterolemia, unspecified: Secondary | ICD-10-CM | POA: Diagnosis not present

## 2019-05-09 DIAGNOSIS — R011 Cardiac murmur, unspecified: Secondary | ICD-10-CM | POA: Diagnosis not present

## 2019-05-09 DIAGNOSIS — I252 Old myocardial infarction: Secondary | ICD-10-CM | POA: Diagnosis not present

## 2019-05-09 DIAGNOSIS — I214 Non-ST elevation (NSTEMI) myocardial infarction: Secondary | ICD-10-CM | POA: Diagnosis not present

## 2019-05-09 DIAGNOSIS — K219 Gastro-esophageal reflux disease without esophagitis: Secondary | ICD-10-CM | POA: Diagnosis not present

## 2019-05-11 DIAGNOSIS — M85832 Other specified disorders of bone density and structure, left forearm: Secondary | ICD-10-CM | POA: Diagnosis not present

## 2019-05-11 DIAGNOSIS — K219 Gastro-esophageal reflux disease without esophagitis: Secondary | ICD-10-CM | POA: Diagnosis not present

## 2019-05-11 DIAGNOSIS — R011 Cardiac murmur, unspecified: Secondary | ICD-10-CM | POA: Diagnosis not present

## 2019-05-11 DIAGNOSIS — I252 Old myocardial infarction: Secondary | ICD-10-CM | POA: Diagnosis not present

## 2019-05-11 DIAGNOSIS — I214 Non-ST elevation (NSTEMI) myocardial infarction: Secondary | ICD-10-CM | POA: Diagnosis not present

## 2019-05-11 DIAGNOSIS — E7800 Pure hypercholesterolemia, unspecified: Secondary | ICD-10-CM | POA: Diagnosis not present

## 2019-05-11 DIAGNOSIS — Z780 Asymptomatic menopausal state: Secondary | ICD-10-CM | POA: Diagnosis not present

## 2019-05-11 DIAGNOSIS — E039 Hypothyroidism, unspecified: Secondary | ICD-10-CM | POA: Diagnosis not present

## 2019-05-14 DIAGNOSIS — R011 Cardiac murmur, unspecified: Secondary | ICD-10-CM | POA: Diagnosis not present

## 2019-05-14 DIAGNOSIS — K219 Gastro-esophageal reflux disease without esophagitis: Secondary | ICD-10-CM | POA: Diagnosis not present

## 2019-05-14 DIAGNOSIS — I214 Non-ST elevation (NSTEMI) myocardial infarction: Secondary | ICD-10-CM | POA: Diagnosis not present

## 2019-05-14 DIAGNOSIS — E7800 Pure hypercholesterolemia, unspecified: Secondary | ICD-10-CM | POA: Diagnosis not present

## 2019-05-14 DIAGNOSIS — I252 Old myocardial infarction: Secondary | ICD-10-CM | POA: Diagnosis not present

## 2019-05-14 DIAGNOSIS — E039 Hypothyroidism, unspecified: Secondary | ICD-10-CM | POA: Diagnosis not present

## 2019-05-15 ENCOUNTER — Other Ambulatory Visit: Payer: Self-pay | Admitting: Family Medicine

## 2019-05-15 DIAGNOSIS — L300 Nummular dermatitis: Secondary | ICD-10-CM | POA: Diagnosis not present

## 2019-05-15 DIAGNOSIS — L710 Perioral dermatitis: Secondary | ICD-10-CM | POA: Diagnosis not present

## 2019-05-16 DIAGNOSIS — I214 Non-ST elevation (NSTEMI) myocardial infarction: Secondary | ICD-10-CM | POA: Diagnosis not present

## 2019-05-16 DIAGNOSIS — E039 Hypothyroidism, unspecified: Secondary | ICD-10-CM | POA: Diagnosis not present

## 2019-05-16 DIAGNOSIS — K219 Gastro-esophageal reflux disease without esophagitis: Secondary | ICD-10-CM | POA: Diagnosis not present

## 2019-05-16 DIAGNOSIS — I252 Old myocardial infarction: Secondary | ICD-10-CM | POA: Diagnosis not present

## 2019-05-16 DIAGNOSIS — E7800 Pure hypercholesterolemia, unspecified: Secondary | ICD-10-CM | POA: Diagnosis not present

## 2019-05-16 DIAGNOSIS — R011 Cardiac murmur, unspecified: Secondary | ICD-10-CM | POA: Diagnosis not present

## 2019-05-17 ENCOUNTER — Other Ambulatory Visit: Payer: Self-pay

## 2019-05-17 DIAGNOSIS — Z780 Asymptomatic menopausal state: Secondary | ICD-10-CM

## 2019-05-21 DIAGNOSIS — E7800 Pure hypercholesterolemia, unspecified: Secondary | ICD-10-CM | POA: Diagnosis not present

## 2019-05-21 DIAGNOSIS — E782 Mixed hyperlipidemia: Secondary | ICD-10-CM | POA: Diagnosis not present

## 2019-05-21 DIAGNOSIS — K219 Gastro-esophageal reflux disease without esophagitis: Secondary | ICD-10-CM | POA: Diagnosis not present

## 2019-05-21 DIAGNOSIS — E039 Hypothyroidism, unspecified: Secondary | ICD-10-CM | POA: Diagnosis not present

## 2019-05-21 DIAGNOSIS — I6523 Occlusion and stenosis of bilateral carotid arteries: Secondary | ICD-10-CM | POA: Diagnosis not present

## 2019-05-21 DIAGNOSIS — Z7982 Long term (current) use of aspirin: Secondary | ICD-10-CM | POA: Diagnosis not present

## 2019-05-21 DIAGNOSIS — I2510 Atherosclerotic heart disease of native coronary artery without angina pectoris: Secondary | ICD-10-CM | POA: Diagnosis not present

## 2019-05-21 DIAGNOSIS — R011 Cardiac murmur, unspecified: Secondary | ICD-10-CM | POA: Diagnosis not present

## 2019-05-21 DIAGNOSIS — I350 Nonrheumatic aortic (valve) stenosis: Secondary | ICD-10-CM | POA: Diagnosis not present

## 2019-05-21 DIAGNOSIS — Z8673 Personal history of transient ischemic attack (TIA), and cerebral infarction without residual deficits: Secondary | ICD-10-CM | POA: Diagnosis not present

## 2019-05-21 DIAGNOSIS — I252 Old myocardial infarction: Secondary | ICD-10-CM | POA: Diagnosis not present

## 2019-05-21 DIAGNOSIS — I10 Essential (primary) hypertension: Secondary | ICD-10-CM | POA: Diagnosis not present

## 2019-05-23 ENCOUNTER — Other Ambulatory Visit: Payer: Self-pay

## 2019-05-23 DIAGNOSIS — I252 Old myocardial infarction: Secondary | ICD-10-CM | POA: Diagnosis not present

## 2019-05-23 DIAGNOSIS — E7800 Pure hypercholesterolemia, unspecified: Secondary | ICD-10-CM | POA: Diagnosis not present

## 2019-05-23 DIAGNOSIS — R011 Cardiac murmur, unspecified: Secondary | ICD-10-CM | POA: Diagnosis not present

## 2019-05-23 DIAGNOSIS — E039 Hypothyroidism, unspecified: Secondary | ICD-10-CM | POA: Diagnosis not present

## 2019-05-23 DIAGNOSIS — K219 Gastro-esophageal reflux disease without esophagitis: Secondary | ICD-10-CM | POA: Diagnosis not present

## 2019-05-23 MED ORDER — ESOMEPRAZOLE MAGNESIUM 20 MG PO PACK: 20.0000 mg | PACK | Freq: Every day | ORAL | 0 refills | Status: DC

## 2019-05-23 NOTE — Progress Notes (Signed)
Patient called and requested 90 day refill on Nexium sent to [facility] to see if it is any cheaper than getting it OTC.

## 2019-05-25 DIAGNOSIS — K219 Gastro-esophageal reflux disease without esophagitis: Secondary | ICD-10-CM | POA: Diagnosis not present

## 2019-05-25 DIAGNOSIS — E7800 Pure hypercholesterolemia, unspecified: Secondary | ICD-10-CM | POA: Diagnosis not present

## 2019-05-25 DIAGNOSIS — R011 Cardiac murmur, unspecified: Secondary | ICD-10-CM | POA: Diagnosis not present

## 2019-05-25 DIAGNOSIS — I252 Old myocardial infarction: Secondary | ICD-10-CM | POA: Diagnosis not present

## 2019-05-25 DIAGNOSIS — E039 Hypothyroidism, unspecified: Secondary | ICD-10-CM | POA: Diagnosis not present

## 2019-05-28 DIAGNOSIS — K219 Gastro-esophageal reflux disease without esophagitis: Secondary | ICD-10-CM | POA: Diagnosis not present

## 2019-05-28 DIAGNOSIS — I252 Old myocardial infarction: Secondary | ICD-10-CM | POA: Diagnosis not present

## 2019-05-28 DIAGNOSIS — E7800 Pure hypercholesterolemia, unspecified: Secondary | ICD-10-CM | POA: Diagnosis not present

## 2019-05-28 DIAGNOSIS — R011 Cardiac murmur, unspecified: Secondary | ICD-10-CM | POA: Diagnosis not present

## 2019-05-28 DIAGNOSIS — E039 Hypothyroidism, unspecified: Secondary | ICD-10-CM | POA: Diagnosis not present

## 2019-05-30 DIAGNOSIS — R011 Cardiac murmur, unspecified: Secondary | ICD-10-CM | POA: Diagnosis not present

## 2019-05-30 DIAGNOSIS — E7800 Pure hypercholesterolemia, unspecified: Secondary | ICD-10-CM | POA: Diagnosis not present

## 2019-05-30 DIAGNOSIS — I252 Old myocardial infarction: Secondary | ICD-10-CM | POA: Diagnosis not present

## 2019-05-30 DIAGNOSIS — E039 Hypothyroidism, unspecified: Secondary | ICD-10-CM | POA: Diagnosis not present

## 2019-05-30 DIAGNOSIS — K219 Gastro-esophageal reflux disease without esophagitis: Secondary | ICD-10-CM | POA: Diagnosis not present

## 2019-06-01 DIAGNOSIS — R011 Cardiac murmur, unspecified: Secondary | ICD-10-CM | POA: Diagnosis not present

## 2019-06-01 DIAGNOSIS — K219 Gastro-esophageal reflux disease without esophagitis: Secondary | ICD-10-CM | POA: Diagnosis not present

## 2019-06-01 DIAGNOSIS — I252 Old myocardial infarction: Secondary | ICD-10-CM | POA: Diagnosis not present

## 2019-06-01 DIAGNOSIS — E039 Hypothyroidism, unspecified: Secondary | ICD-10-CM | POA: Diagnosis not present

## 2019-06-01 DIAGNOSIS — E7800 Pure hypercholesterolemia, unspecified: Secondary | ICD-10-CM | POA: Diagnosis not present

## 2019-06-04 DIAGNOSIS — K219 Gastro-esophageal reflux disease without esophagitis: Secondary | ICD-10-CM | POA: Diagnosis not present

## 2019-06-04 DIAGNOSIS — R011 Cardiac murmur, unspecified: Secondary | ICD-10-CM | POA: Diagnosis not present

## 2019-06-04 DIAGNOSIS — E7800 Pure hypercholesterolemia, unspecified: Secondary | ICD-10-CM | POA: Diagnosis not present

## 2019-06-04 DIAGNOSIS — E039 Hypothyroidism, unspecified: Secondary | ICD-10-CM | POA: Diagnosis not present

## 2019-06-04 DIAGNOSIS — I252 Old myocardial infarction: Secondary | ICD-10-CM | POA: Diagnosis not present

## 2019-06-06 DIAGNOSIS — I252 Old myocardial infarction: Secondary | ICD-10-CM | POA: Diagnosis not present

## 2019-06-06 DIAGNOSIS — E7800 Pure hypercholesterolemia, unspecified: Secondary | ICD-10-CM | POA: Diagnosis not present

## 2019-06-06 DIAGNOSIS — E039 Hypothyroidism, unspecified: Secondary | ICD-10-CM | POA: Diagnosis not present

## 2019-06-06 DIAGNOSIS — R011 Cardiac murmur, unspecified: Secondary | ICD-10-CM | POA: Diagnosis not present

## 2019-06-06 DIAGNOSIS — K219 Gastro-esophageal reflux disease without esophagitis: Secondary | ICD-10-CM | POA: Diagnosis not present

## 2019-06-07 ENCOUNTER — Other Ambulatory Visit: Payer: Self-pay | Admitting: Family Medicine

## 2019-06-08 DIAGNOSIS — E7800 Pure hypercholesterolemia, unspecified: Secondary | ICD-10-CM | POA: Diagnosis not present

## 2019-06-08 DIAGNOSIS — I252 Old myocardial infarction: Secondary | ICD-10-CM | POA: Diagnosis not present

## 2019-06-08 DIAGNOSIS — R011 Cardiac murmur, unspecified: Secondary | ICD-10-CM | POA: Diagnosis not present

## 2019-06-08 DIAGNOSIS — K219 Gastro-esophageal reflux disease without esophagitis: Secondary | ICD-10-CM | POA: Diagnosis not present

## 2019-06-08 DIAGNOSIS — E039 Hypothyroidism, unspecified: Secondary | ICD-10-CM | POA: Diagnosis not present

## 2019-06-11 ENCOUNTER — Encounter: Payer: Self-pay | Admitting: Family Medicine

## 2019-06-11 DIAGNOSIS — E782 Mixed hyperlipidemia: Secondary | ICD-10-CM | POA: Insufficient documentation

## 2019-06-11 DIAGNOSIS — E039 Hypothyroidism, unspecified: Secondary | ICD-10-CM | POA: Diagnosis not present

## 2019-06-11 DIAGNOSIS — R011 Cardiac murmur, unspecified: Secondary | ICD-10-CM | POA: Diagnosis not present

## 2019-06-11 DIAGNOSIS — I252 Old myocardial infarction: Secondary | ICD-10-CM | POA: Diagnosis not present

## 2019-06-11 DIAGNOSIS — K219 Gastro-esophageal reflux disease without esophagitis: Secondary | ICD-10-CM | POA: Diagnosis not present

## 2019-06-11 DIAGNOSIS — E7800 Pure hypercholesterolemia, unspecified: Secondary | ICD-10-CM | POA: Diagnosis not present

## 2019-06-11 DIAGNOSIS — E1169 Type 2 diabetes mellitus with other specified complication: Secondary | ICD-10-CM | POA: Insufficient documentation

## 2019-06-11 NOTE — Progress Notes (Signed)
Subjective:  Patient ID: Megan Aguilar, female    DOB: [birth year]  Age: 69 y.o. MRN: [id number]  Chief Complaint  Patient presents with  . Gastroesophageal Reflux  . Hypertension  . Hyperlipidemia    HPI In regard to the type 2 diabetes mellitus with other specified complication, specifically, this is type 2, non-insulin requiring diabetes without complications.  Compliance with treatment has been good; she takes her medication as directed, follows up as directed, and is keeping a glucose diary.  Current meds include an oral hypoglycemic ( Glucophage ).  She reports home blood glucose readings have averaged fasting readings in the 115-130 mg/dL range. She checks her glucose 1 times per month.  Most recent lab results include glycohemoglobin 6.2%.      Pt presents with hyperlipidemia.  Date of diagnosis 2014.  Current treatment includes a low cholesterol/low fat diet and fish oil 2 caps twice a day..  Compliance with treatment has been good; she follows up as directed.  She denies experiencing any hypercholesterolemia related symptoms.  Concurrent health problems include hypertension, diabetes, and hypothyroidism.      Pt presents for follow up of hypertensive Heart disease.  Date of diagnosis 2020.  Her current cardiac medication regimen includes crestor, clopidegral,  and a Benicar 20 mg once daily.  She is tolerating the medication well without side effects.  Compliance with treatment has been good; she takes her medication as directed and follows up as directed.      Additionally, she presents with history of other specified hypothyroidism.  date of diagnosis 2010.  She is currently taking Synthroid, 50 mcg daily.  She denies any related symptoms.     Past Medical History:  Diagnosis Date  . Adult acne   . Arthritis    "all over" (02/24/2018)  . Chronic bronchitis (HCC)   . Diabetes (HCC)   . GERD (gastroesophageal reflux disease)   . Headache    "at least 1/wk" (02/24/2018)  .  Heart murmur    AS A 69 YR OLD...HASN'T BEEN A PROBLEM SINCE THEN.   . High cholesterol   . Hypertension   . Hypothyroidism    Past Surgical History:  Procedure Laterality Date  . ANTERIOR / POSTERIOR COMBINED FUSION CERVICAL SPINE  02/24/2018   C3-4; C7-T1  . ANTERIOR CERVICAL DECOMP/DISCECTOMY FUSION  2006  . ANTERIOR CERVICAL DECOMP/DISCECTOMY FUSION N/A 02/24/2018   Procedure: Anterior Cervical Decompression Fusion  - Cervical three-Cervical four;  Surgeon: Tia Alert, MD;  Location: [facility] OR;  Service: Neurosurgery;  Laterality: N/A;  . BACK SURGERY    . JOINT REPLACEMENT    . LAPAROSCOPIC CHOLECYSTECTOMY    . LEFT HEART CATH AND CORONARY ANGIOGRAPHY N/A 02/12/2019   Procedure: LEFT HEART CATH AND CORONARY ANGIOGRAPHY;  Surgeon: Yates Decamp, MD;  Location: [facility];  Service: Cardiovascular;  Laterality: N/A;  . POSTERIOR CERVICAL FUSION/FORAMINOTOMY N/A 02/24/2018   Procedure: Posterior cervical decompression and lateral mass fusion Cervical seven -Thoracic one;  Surgeon: Tia Alert, MD;  Location: [facility] OR;  Service: Neurosurgery;  Laterality: N/A;  . TOTAL ABDOMINAL HYSTERECTOMY  2017  . TOTAL HIP ARTHROPLASTY Right 07/2017  . TUBAL LIGATION  1977    Family History  Problem Relation Age of Onset  . Diabetes Mother   . COPD Father   . Aortic stenosis Father   . Alzheimer's disease Father   . Congestive Heart Failure Father   . Diabetes Sister   . Lung cancer Brother   .  Hyperlipidemia Brother   . Hypertension Brother    Social History   Socioeconomic History  . Marital status: Married    Spouse name: Not on file  . Number of children: 2  . Years of education: Not on file  . Highest education level: Not on file  Occupational History  . Not on file  Tobacco Use  . Smoking status: Former Smoker    Packs/day: 2.00    Years: 30.00    Pack years: 60.00    Types: Cigarettes    Quit date: 02/21/2004    Years since quitting: 15.3  . Smokeless tobacco: Never  Used  Substance and Sexual Activity  . Alcohol use: Never  . Drug use: Never  . Sexual activity: Not Currently  Other Topics Concern  . Not on file  Social History Narrative  . Not on file   Social Determinants of Health   Financial Resource Strain:   . Difficulty of Paying Living Expenses:   Food Insecurity:   . Worried About Charity fundraiser in the Last Year:   . Arboriculturist in the Last Year:   Transportation Needs:   . Film/video editor (Medical):   Marland Kitchen Lack of Transportation (Non-Medical):   Physical Activity:   . Days of Exercise per Week:   . Minutes of Exercise per Session:   Stress:   . Feeling of Stress :   Social Connections:   . Frequency of Communication with Friends and Family:   . Frequency of Social Gatherings with Friends and Family:   . Attends Religious Services:   . Active Member of Clubs or Organizations:   . Attends Archivist Meetings:   Marland Kitchen Marital Status:     Review of Systems  Constitutional: Negative for chills, fatigue and fever.  HENT: Negative for congestion, ear pain, rhinorrhea and sore throat.   Respiratory: Negative for cough and shortness of breath.   Cardiovascular: Negative for chest pain.  Gastrointestinal: Negative for abdominal pain, constipation, diarrhea, nausea and vomiting.  Endocrine: Negative for polydipsia, polyphagia and polyuria.  Genitourinary: Negative for dysuria and urgency.  Musculoskeletal: Positive for arthralgias (LEFT HIP. TENDER OVER LEFT TROCHANTERIC BURSA.). Negative for back pain and myalgias.  Neurological: Negative for dizziness, weakness, light-headedness and headaches.  Psychiatric/Behavioral: Positive for sleep disturbance (Problems staying asleep. ). Negative for dysphoric mood. The patient is not nervous/anxious.      Objective:  BP 134/70   Pulse 78   Temp (!) 97.2 F (36.2 C)   Resp 18   Ht 5\' 3"  (1.6 m)   Wt 207 lb (93.9 kg)   BMI 36.67 kg/m   BP/Weight 06/12/2019  04/13/2019 8/[birth year]/1962  Systolic BP 229 798 921  Diastolic BP 70 78 88  Wt. (Lbs) 207 207.6 208.9  BMI 36.67 36.77 37    Physical Exam Constitutional:      Appearance: Normal appearance.  Cardiovascular:     Rate and Rhythm: Normal rate and regular rhythm.  Pulmonary:     Effort: Pulmonary effort is normal.     Breath sounds: Normal breath sounds.  Abdominal:     General: Bowel sounds are normal.     Palpations: Abdomen is soft.     Tenderness: There is no abdominal tenderness.  Musculoskeletal:        General: Tenderness (Left trochanteric bursa.) present. Normal range of motion.  Skin:    General: Skin is warm.  Neurological:     Mental Status: She is  alert.  Psychiatric:        Mood and Affect: Mood normal.        Behavior: Behavior normal.     Lab Results  Component Value Date   WBC 5.4 06/12/2019   HGB 11.7 06/12/2019   HCT 38.0 06/12/2019   PLT 300 06/12/2019   GLUCOSE 97 06/12/2019   CHOL 131 06/12/2019   TRIG 170 (H) 06/12/2019   HDL 69 06/12/2019   LDLCALC 35 06/12/2019   ALT 24 06/12/2019   AST 29 06/12/2019   NA 142 06/12/2019   K 4.7 06/12/2019   CL 102 06/12/2019   CREATININE 0.99 06/12/2019   BUN 15 06/12/2019   CO2 21 06/12/2019   TSH 2.832 02/11/2019   HGBA1C 6.2 (H) 06/12/2019   MICROALBUR 30 06/16/2019      Assessment & Plan:  1. Essential hypertension Well controlled.  No changes to medicines.  Continue to work on eating a healthy diet and exercise.  Labs drawn today.  - Comprehensive metabolic panel - CBC with Differential/Platelet  2. Mixed hyperlipidemia Well controlled.  No changes to medicines.  Continue to work on eating a healthy diet and exercise.  Labs drawn today.  - Lipid panel  3. Combined hyperlipidemia associated with type 2 diabetes mellitus (HCC) Control: good Recommend check sugars fasting daily. Recommend check feet daily. Recommend annual eye exams. Medicines: no changes. Continue to work on eating a  healthy diet and exercise.  Labs drawn today.   - A1C. - POCT MICROALBUMIN  5. Trochanteric bursitis of left hip Risks were discussed including bleeding, infection, increase in sugars if diabetic, atrophy at site of injection, and increased pain.  After consent was obtained, using sterile technique the left trochanteric bursa was prepped with betadine and alcohol.  Kenalog 40 mg and 5 ml plain Lidocaine was then injected and the needle withdrawn.  The procedure was well tolerated. The patient is asked to continue to rest the joint for a few more days before resuming regular activities.  It may be more painful for the first 1-2 days.  Watch for fever, or increased swelling or persistent pain in the joint. Call or return to clinic prn if such symptoms occur or there is failure to improve as anticipated.  6. Primary insomnia Start on trazodone 50 mg once daily at night.  7. Class 2 severe obesity due to excess calories with serious comorbidity and body mass index (BMI) of 36.0 to 36.9 in adult [facility]) Recommend continue to work on eating healthy diet and exercise.  Orders Placed This Encounter  Procedures  . Lipid panel  . Comprehensive metabolic panel  . CBC with Differential/Platelet  . Hemoglobin A1c  . Cardiovascular Risk Assessment  . POCT UA - Microalbumin     Follow-up: Return in about 3 months (around 09/11/2019) for fasting. Also needs to come in for a kenalog injection (bursitis).  An After Visit Summary was printed and given to the patient.  Blane Ohara [facility] [phone]

## 2019-06-12 ENCOUNTER — Other Ambulatory Visit: Payer: Self-pay

## 2019-06-12 ENCOUNTER — Ambulatory Visit (INDEPENDENT_AMBULATORY_CARE_PROVIDER_SITE_OTHER): Payer: Medicare Other | Admitting: Family Medicine

## 2019-06-12 ENCOUNTER — Encounter: Payer: Self-pay | Admitting: Family Medicine

## 2019-06-12 VITALS — BP 134/70 | HR 78 | Temp 97.2°F | Resp 18 | Ht 63.0 in | Wt 207.0 lb

## 2019-06-12 DIAGNOSIS — E1169 Type 2 diabetes mellitus with other specified complication: Secondary | ICD-10-CM | POA: Diagnosis not present

## 2019-06-12 DIAGNOSIS — E782 Mixed hyperlipidemia: Secondary | ICD-10-CM | POA: Diagnosis not present

## 2019-06-12 DIAGNOSIS — I10 Essential (primary) hypertension: Secondary | ICD-10-CM | POA: Diagnosis not present

## 2019-06-12 DIAGNOSIS — M7062 Trochanteric bursitis, left hip: Secondary | ICD-10-CM

## 2019-06-12 DIAGNOSIS — F5101 Primary insomnia: Secondary | ICD-10-CM

## 2019-06-12 DIAGNOSIS — Z6836 Body mass index (BMI) 36.0-36.9, adult: Secondary | ICD-10-CM | POA: Diagnosis not present

## 2019-06-12 MED ORDER — RABEPRAZOLE SODIUM 20 MG PO TBEC: 20.0000 mg | DELAYED_RELEASE_TABLET | Freq: Every day | ORAL | 3 refills | Status: AC

## 2019-06-12 MED ORDER — TRAZODONE HCL 50 MG PO TABS
50.0000 mg | ORAL_TABLET | Freq: Every evening | ORAL | 3 refills | Status: DC | PRN
Start: 2019-06-12 — End: 2020-06-02

## 2019-06-12 NOTE — Patient Instructions (Addendum)
Insomnia: trazodone 50 mg once daily at night.

## 2019-06-13 DIAGNOSIS — E7800 Pure hypercholesterolemia, unspecified: Secondary | ICD-10-CM | POA: Diagnosis not present

## 2019-06-13 DIAGNOSIS — E039 Hypothyroidism, unspecified: Secondary | ICD-10-CM | POA: Diagnosis not present

## 2019-06-13 DIAGNOSIS — R011 Cardiac murmur, unspecified: Secondary | ICD-10-CM | POA: Diagnosis not present

## 2019-06-13 DIAGNOSIS — I252 Old myocardial infarction: Secondary | ICD-10-CM | POA: Diagnosis not present

## 2019-06-13 DIAGNOSIS — K219 Gastro-esophageal reflux disease without esophagitis: Secondary | ICD-10-CM | POA: Diagnosis not present

## 2019-06-13 LAB — CBC WITH DIFFERENTIAL/PLATELET
Basophils Absolute: 0.1 10*3/uL (ref 0.0–0.2)
Basos: 1 %
EOS (ABSOLUTE): 0.3 10*3/uL (ref 0.0–0.4)
Eos: 6 %
Hematocrit: 38 % (ref 34.0–46.6)
Hemoglobin: 11.7 g/dL (ref 11.1–15.9)
Immature Grans (Abs): 0 10*3/uL (ref 0.0–0.1)
Immature Granulocytes: 0 %
Lymphocytes Absolute: 2.3 10*3/uL (ref 0.7–3.1)
Lymphs: 42 %
MCH: 24.9 pg — ABNORMAL LOW (ref 26.6–33.0)
MCHC: 30.8 g/dL — ABNORMAL LOW (ref 31.5–35.7)
MCV: 81 fL (ref 79–97)
Monocytes Absolute: 0.6 10*3/uL (ref 0.1–0.9)
Monocytes: 12 %
Neutrophils Absolute: 2.1 10*3/uL (ref 1.4–7.0)
Neutrophils: 39 %
Platelets: 300 10*3/uL (ref 150–450)
RBC: 4.69 x10E6/uL (ref 3.77–5.28)
RDW: 17.3 % — ABNORMAL HIGH (ref 11.7–15.4)
WBC: 5.4 10*3/uL (ref 3.4–10.8)

## 2019-06-13 LAB — COMPREHENSIVE METABOLIC PANEL
ALT: 24 IU/L (ref 0–32)
AST: 29 IU/L (ref 0–40)
Albumin/Globulin Ratio: 2.2 (ref 1.2–2.2)
Albumin: 4.9 g/dL — ABNORMAL HIGH (ref 3.8–4.8)
Alkaline Phosphatase: 126 IU/L — ABNORMAL HIGH (ref 39–117)
BUN/Creatinine Ratio: 15 (ref 12–28)
BUN: 15 mg/dL (ref 8–27)
Bilirubin Total: 0.4 mg/dL (ref 0.0–1.2)
CO2: 21 mmol/L (ref 20–29)
Calcium: 10.5 mg/dL — ABNORMAL HIGH (ref 8.7–10.3)
Chloride: 102 mmol/L (ref 96–106)
Creatinine, Ser: 0.99 mg/dL (ref 0.57–1.00)
GFR calc Af Amer: 68 mL/min/{1.73_m2} (ref >59)
GFR calc non Af Amer: 59 mL/min/{1.73_m2} — ABNORMAL LOW (ref >59)
Globulin, Total: 2.2 g/dL (ref 1.5–4.5)
Glucose: 97 mg/dL (ref 65–99)
Potassium: 4.7 mmol/L (ref 3.5–5.2)
Sodium: 142 mmol/L (ref 134–144)
Total Protein: 7.1 g/dL (ref 6.0–8.5)

## 2019-06-13 LAB — LIPID PANEL
Chol/HDL Ratio: 1.9 ratio (ref 0.0–4.4)
Cholesterol, Total: 131 mg/dL (ref 100–199)
HDL: 69 mg/dL (ref >39)
LDL Chol Calc (NIH): 35 mg/dL (ref 0–99)
Triglycerides: 170 mg/dL — ABNORMAL HIGH (ref 0–149)
VLDL Cholesterol Cal: 27 mg/dL (ref 5–40)

## 2019-06-13 LAB — HEMOGLOBIN A1C
Est. average glucose Bld gHb Est-mCnc: 131 mg/dL
Hgb A1c MFr Bld: 6.2 % — ABNORMAL HIGH (ref 4.8–5.6)

## 2019-06-13 LAB — CARDIOVASCULAR RISK ASSESSMENT

## 2019-06-14 ENCOUNTER — Ambulatory Visit: Payer: Medicare Other | Admitting: Family Medicine

## 2019-06-15 DIAGNOSIS — E039 Hypothyroidism, unspecified: Secondary | ICD-10-CM | POA: Diagnosis not present

## 2019-06-15 DIAGNOSIS — R011 Cardiac murmur, unspecified: Secondary | ICD-10-CM | POA: Diagnosis not present

## 2019-06-15 DIAGNOSIS — I252 Old myocardial infarction: Secondary | ICD-10-CM | POA: Diagnosis not present

## 2019-06-15 DIAGNOSIS — E7800 Pure hypercholesterolemia, unspecified: Secondary | ICD-10-CM | POA: Diagnosis not present

## 2019-06-15 DIAGNOSIS — K219 Gastro-esophageal reflux disease without esophagitis: Secondary | ICD-10-CM | POA: Diagnosis not present

## 2019-06-16 DIAGNOSIS — F5101 Primary insomnia: Secondary | ICD-10-CM | POA: Insufficient documentation

## 2019-06-16 DIAGNOSIS — M7062 Trochanteric bursitis, left hip: Secondary | ICD-10-CM | POA: Insufficient documentation

## 2019-06-16 LAB — POCT UA - MICROALBUMIN: Microalbumin Ur, POC: 30 mg/L

## 2019-06-18 ENCOUNTER — Other Ambulatory Visit: Payer: Self-pay

## 2019-06-18 DIAGNOSIS — I10 Essential (primary) hypertension: Secondary | ICD-10-CM

## 2019-06-18 DIAGNOSIS — I252 Old myocardial infarction: Secondary | ICD-10-CM | POA: Diagnosis not present

## 2019-06-18 MED ORDER — ISOSORBIDE MONONITRATE ER 30 MG PO TB24: 30.0000 mg | ORAL_TABLET | Freq: Every morning | ORAL | 0 refills | Status: DC

## 2019-06-18 NOTE — Telephone Encounter (Signed)
Patient called requesting a medication refill.  1) Medication(s) Requested (by name): IMDUR 30 mg #90  2) Pharmacy of Choice: [facility]  3) Special Requests: 90 day supply   Approved medications will be sent to the pharmacy, we will reach out if there is an issue.  Requests made after 3pm may not be addressed until the following business day!  If a patient is unsure of the name of the medication(s) please note and ask patient to call back when they are able to provide all info, do not send to responsible party until all information is available!

## 2019-06-20 DIAGNOSIS — I252 Old myocardial infarction: Secondary | ICD-10-CM | POA: Diagnosis not present

## 2019-06-23 ENCOUNTER — Other Ambulatory Visit: Payer: Self-pay | Admitting: Family Medicine

## 2019-07-10 ENCOUNTER — Telehealth: Payer: Self-pay

## 2019-07-10 ENCOUNTER — Other Ambulatory Visit: Payer: Self-pay | Admitting: Family Medicine

## 2019-07-10 DIAGNOSIS — I63512 Cerebral infarction due to unspecified occlusion or stenosis of left middle cerebral artery: Secondary | ICD-10-CM | POA: Diagnosis not present

## 2019-07-10 MED ORDER — ALPRAZOLAM 0.5 MG PO TABS
0.5000 mg | ORAL_TABLET | Freq: Two times a day (BID) | ORAL | 0 refills | Status: DC | PRN
Start: 2019-07-10 — End: 2019-09-14

## 2019-07-10 NOTE — Progress Notes (Signed)
Xanax sent. Due to anxiety concerning failing health of husband. Kc

## 2019-07-10 NOTE — Telephone Encounter (Signed)
Megan Aguilar called crying and upset.  She is caring for her husband who currently is under Hospice Care for stage IV pancreatic cancer. She wants medication to help during this difficult time.  She request that the medication be sent to [facility].  Dr. Sedalia Muta will send medication.

## 2019-07-12 DIAGNOSIS — I63512 Cerebral infarction due to unspecified occlusion or stenosis of left middle cerebral artery: Secondary | ICD-10-CM | POA: Insufficient documentation

## 2019-07-25 ENCOUNTER — Other Ambulatory Visit: Payer: Self-pay

## 2019-07-25 DIAGNOSIS — I10 Essential (primary) hypertension: Secondary | ICD-10-CM

## 2019-07-25 MED ORDER — AMLODIPINE BESYLATE 5 MG PO TABS: 5.0000 mg | ORAL_TABLET | Freq: Every day | ORAL | 0 refills | Status: DC

## 2019-07-25 MED ORDER — AMLODIPINE BESYLATE 5 MG PO TABS: 5.0000 mg | ORAL_TABLET | Freq: Every day | ORAL | 2 refills | Status: DC

## 2019-07-25 NOTE — Telephone Encounter (Signed)
Patient needs Amlodipine refilled

## 2019-07-27 ENCOUNTER — Other Ambulatory Visit: Payer: Self-pay | Admitting: Physician Assistant

## 2019-07-27 DIAGNOSIS — I10 Essential (primary) hypertension: Secondary | ICD-10-CM

## 2019-07-29 ENCOUNTER — Other Ambulatory Visit: Payer: Self-pay | Admitting: Family Medicine

## 2019-07-29 DIAGNOSIS — J301 Allergic rhinitis due to pollen: Secondary | ICD-10-CM

## 2019-08-23 ENCOUNTER — Other Ambulatory Visit: Payer: Self-pay

## 2019-08-23 ENCOUNTER — Encounter: Payer: Self-pay | Admitting: Legal Medicine

## 2019-08-23 ENCOUNTER — Ambulatory Visit (INDEPENDENT_AMBULATORY_CARE_PROVIDER_SITE_OTHER): Payer: Medicare Other | Admitting: Legal Medicine

## 2019-08-23 VITALS — BP 130/70 | HR 80 | Temp 97.3°F | Resp 16 | Ht 63.0 in | Wt 189.4 lb

## 2019-08-23 DIAGNOSIS — T7840XA Allergy, unspecified, initial encounter: Secondary | ICD-10-CM

## 2019-08-23 DIAGNOSIS — F321 Major depressive disorder, single episode, moderate: Secondary | ICD-10-CM | POA: Diagnosis not present

## 2019-08-23 MED ORDER — ESCITALOPRAM OXALATE 20 MG PO TABS: 20.0000 mg | ORAL_TABLET | Freq: Every day | ORAL | 3 refills | Status: DC

## 2019-08-23 MED ORDER — TRIAMCINOLONE ACETONIDE 40 MG/ML IJ SUSP
60.0000 mg | Freq: Once | INTRAMUSCULAR | Status: AC
  Administered 2019-08-23: 60 mg via INTRAMUSCULAR

## 2019-08-23 NOTE — Progress Notes (Signed)
Subjective:  Patient ID: Megan Aguilar, female    DOB: 15-Jan-1951  Age: 69 y.o. MRN: [id number]  Chief Complaint  Patient presents with  . Rash  . Trouble Swallowing    HPI: patient going through grief. Husband died in may She has been crying a lot and sleeping poorly, she has a good support system and family is of assistance.  She will be going to grief counseling soon.  She has a red rash in upper arms and is scratching and excoriating it .  It is recurrent and may be self-induced.   Current Outpatient Medications on File Prior to Visit  Medication Sig Dispense Refill  . ALPRAZolam (XANAX) 0.5 MG tablet Take 1 tablet (0.5 mg total) by mouth 2 (two) times daily as needed for anxiety. 20 tablet 0  . amLODipine (NORVASC) 5 MG tablet TAKE 1 TABLET BY MOUTH EVERY DAY 90 tablet 1  . aspirin 81 MG EC tablet Take 81 mg by mouth daily.     . B Complex-C (B-COMPLEX WITH VITAMIN C) tablet Take 1 tablet by mouth daily.    Claris Gower Grape-Goldenseal (BERBERINE COMPLEX PO) Take 1 capsule by mouth daily.    . Calcium Carb-Cholecalciferol (CALCIUM 600+D) 600-800 MG-UNIT TABS Take 1 tablet by mouth daily.    . clopidogrel (PLAVIX) 75 MG tablet Take 1 tablet (75 mg total) by mouth daily. 90 tablet 2  . Coenzyme Q10 (CO Q-10) 200 MG CAPS Take 200 mg by mouth daily.    . Cyanocobalamin (VITAMIN B-12 PO) Take 5,000 mcg by mouth daily.    . fexofenadine (ALLEGRA) 180 MG tablet Take 180 mg by mouth at bedtime.    . fluticasone (FLONASE) 50 MCG/ACT nasal spray PLACE 2 SPRAYS INTO BOTH NOSTRILS DAILY AS NEEDED FOR ALLERGIES OR RHINITIS. 48 mL 1  . FREESTYLE LITE test strip USE TO TEST SUGAR DAILY    . gabapentin (NEURONTIN) 300 MG capsule Take 300 mg by mouth daily.     . Glucosamine-Chondroit-Vit C-Mn (GLUCOSAMINE 1500 COMPLEX PO) Take 3,000 mg by mouth daily.    . isosorbide mononitrate (IMDUR) 30 MG 24 hr tablet Take 1 tablet (30 mg total) by mouth every morning. 90 tablet 0  . ketoconazole  (NIZORAL) 2 % shampoo Apply 1 application topically See admin instructions. 2 to 3 times a week    . levothyroxine (SYNTHROID) 50 MCG tablet TAKE 1 TABLET BY MOUTH EVERY DAY 90 tablet 0  . magnesium oxide (MAG-OX) 400 MG tablet Take 800 mg by mouth daily.    . metFORMIN (GLUCOPHAGE) 500 MG tablet TAKE 2 TABLET (1000 MG) BY ORAL ROUTE DAILY 180 tablet 0  . nitroGLYCERIN (NITROSTAT) 0.4 MG SL tablet Place 1 tablet (0.4 mg total) under the tongue every 5 (five) minutes as needed for chest pain. 25 tablet 1  . olmesartan (BENICAR) 20 MG tablet TAKE 1 TABLET BY MOUTH EVERY DAY 30 tablet 11  . Omega-3 Fatty Acids (FISH OIL) 1200 MG CAPS Take 1,200 mg by mouth daily.    . RABEprazole (ACIPHEX) 20 MG tablet Take 1 tablet (20 mg total) by mouth daily. 90 tablet 3  . rosuvastatin (CRESTOR) 20 MG tablet Take 1 tablet (20 mg total) by mouth daily. 90 tablet 3  . tiZANidine (ZANAFLEX) 4 MG tablet Take 4 mg by mouth at bedtime.     . traZODone (DESYREL) 50 MG tablet Take 1 tablet (50 mg total) by mouth at bedtime as needed for sleep. 30 tablet 3  .  TURMERIC PO Take 538 mg by mouth daily.     No current facility-administered medications on file prior to visit.   Past Medical History:  Diagnosis Date  . Adult acne   . Arthritis    "all over" (02/24/2018)  . Chronic bronchitis (HCC)   . Diabetes (HCC)   . GERD (gastroesophageal reflux disease)   . Headache    "at least 1/wk" (02/24/2018)  . Heart murmur    AS A 69 YR OLD...HASN'T BEEN A PROBLEM SINCE THEN.   . High cholesterol   . Hypertension   . Hypothyroidism    Past Surgical History:  Procedure Laterality Date  . ANTERIOR / POSTERIOR COMBINED FUSION CERVICAL SPINE  02/24/2018   C3-4; C7-T1  . ANTERIOR CERVICAL DECOMP/DISCECTOMY FUSION  2006  . ANTERIOR CERVICAL DECOMP/DISCECTOMY FUSION N/A 02/24/2018   Procedure: Anterior Cervical Decompression Fusion  - Cervical three-Cervical four;  Surgeon: Tia Alert, MD;  Location: [facility];  Service:  Neurosurgery;  Laterality: N/A;  . BACK SURGERY    . JOINT REPLACEMENT    . LAPAROSCOPIC CHOLECYSTECTOMY    . LEFT HEART CATH AND CORONARY ANGIOGRAPHY N/A 02/12/2019   Procedure: LEFT HEART CATH AND CORONARY ANGIOGRAPHY;  Surgeon: Yates Decamp, MD;  Location: [facility] IN[facility];  Service: Cardiovascular;  Laterality: N/A;  . POSTERIOR CERVICAL FUSION/FORAMINOTOMY N/A 02/24/2018   Procedure: Posterior cervical decompression and lateral mass fusion Cervical seven -Thoracic one;  Surgeon: Tia Alert, MD;  Location: [facility] OR;  Service: Neurosurgery;  Laterality: N/A;  . TOTAL ABDOMINAL HYSTERECTOMY  2017  . TOTAL HIP ARTHROPLASTY Right 07/2017  . TUBAL LIGATION  1977    Family History  Problem Relation Age of Onset  . Diabetes Mother   . COPD Father   . Aortic stenosis Father   . Alzheimer's disease Father   . Congestive Heart Failure Father   . Diabetes Sister   . Lung cancer Brother   . Hyperlipidemia Brother   . Hypertension Brother    Social History   Socioeconomic History  . Marital status: Married    Spouse name: Not on file  . Number of children: 2  . Years of education: Not on file  . Highest education level: Not on file  Occupational History  . Not on file  Tobacco Use  . Smoking status: Former Smoker    Packs/day: 2.00    Years: 30.00    Pack years: 60.00    Types: Cigarettes    Quit date: 02/21/2004    Years since quitting: 15.5  . Smokeless tobacco: Never Used  Vaping Use  . Vaping Use: Never used  Substance and Sexual Activity  . Alcohol use: Never  . Drug use: Never  . Sexual activity: Not Currently  Other Topics Concern  . Not on file  Social History Narrative  . Not on file   Social Determinants of Health   Financial Resource Strain:   . Difficulty of Paying Living Expenses:   Food Insecurity:   . Worried About Programme researcher, broadcasting/film/video in the Last Year:   . Barista in the Last Year:   Transportation Needs:   . Freight forwarder (Medical):    Marland Kitchen Lack of Transportation (Non-Medical):   Physical Activity:   . Days of Exercise per Week:   . Minutes of Exercise per Session:   Stress:   . Feeling of Stress :   Social Connections:   . Frequency of Communication with Friends and  Family:   . Frequency of Social Gatherings with Friends and Family:   . Attends Religious Services:   . Active Member of Clubs or Organizations:   . Attends Banker Meetings:   Marland Kitchen Marital Status:     Review of Systems  Constitutional: Negative.   HENT: Negative.   Eyes: Negative.   Respiratory: Negative.   Cardiovascular: Negative.   Gastrointestinal: Negative.   Genitourinary: Negative.   Musculoskeletal: Negative.   Skin: Positive for rash.  Neurological: Negative.   Psychiatric/Behavioral: Positive for dysphoric mood.     Objective:  BP 130/70   Pulse 80   Temp (!) 97.3 F (36.3 C)   Resp 16   Ht 5\' 3"  (1.6 m)   Wt 189 lb 6.4 oz (85.9 kg)   SpO2 96%   BMI 33.55 kg/m   BP/Weight 08/23/2019 06/12/2019 04/13/2019  Systolic BP 130 134 122  Diastolic BP 70 70 78  Wt. (Lbs) 189.4 207 207.6  BMI 33.55 36.67 36.77    Physical Exam Vitals reviewed.  Constitutional:      Appearance: Normal appearance.  Eyes:     Extraocular Movements: Extraocular movements intact.     Conjunctiva/sclera: Conjunctivae normal.     Pupils: Pupils are equal, round, and reactive to light.  Cardiovascular:     Rate and Rhythm: Normal rate and regular rhythm.     Pulses: Normal pulses.     Heart sounds: Normal heart sounds.  Pulmonary:     Effort: Pulmonary effort is normal.     Breath sounds: Normal breath sounds.  Musculoskeletal:        General: Normal range of motion.  Skin:    Comments: Erythematous rash upper arms that is excoriated  Neurological:     Mental Status: She is alert.  Psychiatric:     Comments: crying    Depression screen [facility] 2/9 08/23/2019 04/13/2019  Decreased Interest 1 0  Down, Depressed, Hopeless 3 0  PHQ - 2  Score 4 0  Altered sleeping 3 -  Tired, decreased energy 3 -  Change in appetite 3 -  Feeling bad or failure about yourself  0 -  Trouble concentrating 3 -  Moving slowly or fidgety/restless 2 -  Suicidal thoughts 0 -  PHQ-9 Score 18 -     Lab Results  Component Value Date   WBC 5.4 06/12/2019   HGB 11.7 06/12/2019   HCT 38.0 06/12/2019   PLT 300 06/12/2019   GLUCOSE 97 06/12/2019   CHOL 131 06/12/2019   TRIG 170 (H) 06/12/2019   HDL 69 06/12/2019   LDLCALC 35 06/12/2019   ALT 24 06/12/2019   AST 29 06/12/2019   NA 142 06/12/2019   K 4.7 06/12/2019   CL 102 06/12/2019   CREATININE 0.99 06/12/2019   BUN 15 06/12/2019   CO2 21 06/12/2019   TSH 2.832 02/11/2019   HGBA1C 6.2 (H) 06/12/2019   MICROALBUR 30 06/16/2019      Assessment & Plan:   1. Allergic rash present on examination - triamcinolone acetonide (KENALOG-40) injection 60 mg Patient gets recurrent rash on upper arms that is pruritic and excoriated.  Try moisturizing and kenalog  2. Depression, major, single episode, moderate (HCC) - escitalopram (LEXAPRO) 20 MG tablet; Take 1 tablet (20 mg total) by mouth daily.  Dispense: 30 tablet; Refill: 3 Patient's depression start on lexapro today  is uncontrolled with no medicine.   Anhedonia worse.  PHQ 9 was performed score 18. An individual care  plan was established or reinforced today.  The patient's disease status was assessed using clinical findings on exam, labs, and or other diagnostic testing to determine patient's success in meeting treatment goals based on disease specific evidence-based guidelines and found to be worsening Recommendations include put on lexapro follow up 2 weeks    Meds ordered this encounter  Medications  . triamcinolone acetonide (KENALOG-40) injection 60 mg  . escitalopram (LEXAPRO) 20 MG tablet    Sig: Take 1 tablet (20 mg total) by mouth daily.    Dispense:  30 tablet    Refill:  3       Follow-up: Return in about 2 weeks  (around 09/06/2019) for depression.  An After Visit Summary was printed and given to the patient.  Brent BullaLawrence Tennis [facility] [phone]

## 2019-09-03 DIAGNOSIS — E782 Mixed hyperlipidemia: Secondary | ICD-10-CM | POA: Diagnosis not present

## 2019-09-03 DIAGNOSIS — I6523 Occlusion and stenosis of bilateral carotid arteries: Secondary | ICD-10-CM | POA: Diagnosis not present

## 2019-09-03 DIAGNOSIS — I2510 Atherosclerotic heart disease of native coronary artery without angina pectoris: Secondary | ICD-10-CM | POA: Diagnosis not present

## 2019-09-03 DIAGNOSIS — I252 Old myocardial infarction: Secondary | ICD-10-CM | POA: Diagnosis not present

## 2019-09-03 DIAGNOSIS — I10 Essential (primary) hypertension: Secondary | ICD-10-CM | POA: Diagnosis not present

## 2019-09-03 DIAGNOSIS — I350 Nonrheumatic aortic (valve) stenosis: Secondary | ICD-10-CM | POA: Diagnosis not present

## 2019-09-04 DIAGNOSIS — M7062 Trochanteric bursitis, left hip: Secondary | ICD-10-CM | POA: Diagnosis not present

## 2019-09-04 DIAGNOSIS — M7061 Trochanteric bursitis, right hip: Secondary | ICD-10-CM | POA: Diagnosis not present

## 2019-09-05 ENCOUNTER — Encounter: Payer: Self-pay | Admitting: Family Medicine

## 2019-09-05 DIAGNOSIS — I6523 Occlusion and stenosis of bilateral carotid arteries: Secondary | ICD-10-CM | POA: Diagnosis not present

## 2019-09-12 ENCOUNTER — Other Ambulatory Visit: Payer: Medicare Other

## 2019-09-12 NOTE — Progress Notes (Signed)
Subjective:  Patient ID: Megan Aguilar, female    DOB: 30-Nov-1950  Age: 69 y.o. MRN: [id number]  Chief Complaint  Patient presents with  . Diabetes  . Hyperlipidemia  . Hypertension  . Hypothyroidism    HPI In regard to the type 2 diabetes mellitus with other specified complication, specifically, this is type 2, non-insulin requiring diabetes without complications.  Compliance with treatment has been good; she takes her medication as directed, follows up as directed, and is keeping a glucose diary.  Current meds include an oral hypoglycemic ( Glucophage ).  She reports home blood glucose readings have averaged fasting readings in the 90-110 mg/dL range. She checks her glucose 1 times per month.  Most recent lab results include glycohemoglobin 6.2%.      Pt presents with hyperlipidemia.  Date of diagnosis 2014.  Current treatment includes a low cholesterol/low fat diet, crestor,  and fish oil 2 caps twice a day..  Compliance with treatment has been good; she follows up as directed.  She denies experiencing any hypercholesterolemia related symptoms.  Concurrent health problems include hypertension, diabetes, and hypothyroidism.      Pt presents for follow up of hypertensive Heart disease.  Date of diagnosis 2020.  Her current cardiac medication regimen includes crestor,imdur, clopidegral, aspirin, and a Benicar 20 mg once daily.  She is tolerating the medication well without side effects.  Compliance with treatment has been good; she takes her medication as directed and follows up as directed.      Additionally, she presents with history of other specified hypothyroidism.  date of diagnosis 2010.  She is currently taking Synthroid, 50 mcg daily.  She denies any related symptoms.     Past Medical History:  Diagnosis Date  . Adult acne   . Arthritis    "all over" (02/24/2018)  . Chronic bronchitis (HCC)   . Diabetes (HCC)   . GERD (gastroesophageal reflux disease)   . Headache    "at  least 1/wk" (02/24/2018)  . Heart murmur    AS A 69 YR OLD...HASN'T BEEN A PROBLEM SINCE THEN.   . High cholesterol   . Hypertension   . Hypothyroidism    Past Surgical History:  Procedure Laterality Date  . ANTERIOR / POSTERIOR COMBINED FUSION CERVICAL SPINE  02/24/2018   C3-4; C7-T1  . ANTERIOR CERVICAL DECOMP/DISCECTOMY FUSION  2006  . ANTERIOR CERVICAL DECOMP/DISCECTOMY FUSION N/A 02/24/2018   Procedure: Anterior Cervical Decompression Fusion  - Cervical three-Cervical four;  Surgeon: Tia Alert, MD;  Location: [facility] OR;  Service: Neurosurgery;  Laterality: N/A;  . BACK SURGERY    . JOINT REPLACEMENT    . LAPAROSCOPIC CHOLECYSTECTOMY    . LEFT HEART CATH AND CORONARY ANGIOGRAPHY N/A 02/12/2019   Procedure: LEFT HEART CATH AND CORONARY ANGIOGRAPHY;  Surgeon: Yates Decamp, MD;  Location: [facility] INVAS[facility] LAB;  Service: Cardiovascular;  Laterality: N/A;  . POSTERIOR CERVICAL FUSION/FORAMINOTOMY N/A 02/24/2018   Procedure: Posterior cervical decompression and lateral mass fusion Cervical seven -Thoracic one;  Surgeon: Tia Alert, MD;  Location: [facility] OR;  Service: Neurosurgery;  Laterality: N/A;  . TOTAL ABDOMINAL HYSTERECTOMY  2017  . TOTAL HIP ARTHROPLASTY Right 07/2017  . TUBAL LIGATION  1977    Family History  Problem Relation Age of Onset  . Diabetes Mother   . COPD Father   . Aortic stenosis Father   . Alzheimer's disease Father   . Congestive Heart Failure Father   . Diabetes Sister   .  Lung cancer Brother   . Hyperlipidemia Brother   . Hypertension Brother    Social History   Socioeconomic History  . Marital status: Married    Spouse name: Not on file  . Number of children: 2  . Years of education: Not on file  . Highest education level: Not on file  Occupational History  . Not on file  Tobacco Use  . Smoking status: Former Smoker    Packs/day: 2.00    Years: 30.00    Pack years: 60.00    Types: Cigarettes    Quit date: 02/21/2004    Years since quitting: 15.5   . Smokeless tobacco: Never Used  Vaping Use  . Vaping Use: Never used  Substance and Sexual Activity  . Alcohol use: Never  . Drug use: Never  . Sexual activity: Not Currently  Other Topics Concern  . Not on file  Social History Narrative  . Not on file   Social Determinants of Health   Financial Resource Strain:   . Difficulty of Paying Living Expenses:   Food Insecurity:   . Worried About Programme researcher, broadcasting/film/video in the Last Year:   . Barista in the Last Year:   Transportation Needs:   . Freight forwarder (Medical):   Marland Kitchen Lack of Transportation (Non-Medical):   Physical Activity:   . Days of Exercise per Week:   . Minutes of Exercise per Session:   Stress:   . Feeling of Stress :   Social Connections:   . Frequency of Communication with Friends and Family:   . Frequency of Social Gatherings with Friends and Family:   . Attends Religious Services:   . Active Member of Clubs or Organizations:   . Attends Banker Meetings:   Marland Kitchen Marital Status:     Review of Systems  Constitutional: Negative for chills, fatigue and fever.  HENT: Positive for rhinorrhea. Negative for congestion, ear pain and sore throat.   Respiratory: Negative for cough and shortness of breath.   Cardiovascular: Negative for chest pain.  Gastrointestinal: Positive for diarrhea. Negative for abdominal pain, constipation and vomiting.  Endocrine: Negative for polydipsia, polyphagia and polyuria.  Genitourinary: Negative for dysuria and urgency.  Musculoskeletal: Positive for arthralgias (Hip pain has improved, had two injx last weeks). Negative for back pain and myalgias.  Neurological: Negative for dizziness, weakness, light-headedness and headaches.  Psychiatric/Behavioral: Positive for dysphoric mood (Husband passed away). Negative for sleep disturbance (Problems staying asleep. ). The patient is not nervous/anxious.      Objective:  BP (!) 124/64   Pulse 63   Temp (!) 97.4 F  (36.3 C)   Ht 5\' 3"  (1.6 m)   Wt 183 lb (83 kg)   SpO2 94%   BMI 32.42 kg/m   BP/Weight 09/14/2019 08/23/2019 06/12/2019  Systolic BP 124 130 134  Diastolic BP 64 70 70  Wt. (Lbs) 183 189.4 207  BMI 32.42 33.55 36.67    Physical Exam Constitutional:      Appearance: Normal appearance.  Cardiovascular:     Rate and Rhythm: Normal rate and regular rhythm.  Pulmonary:     Effort: Pulmonary effort is normal.     Breath sounds: Normal breath sounds.  Abdominal:     General: Bowel sounds are normal.     Palpations: Abdomen is soft.     Tenderness: There is no abdominal tenderness.  Skin:    General: Skin is warm.  Neurological:  Mental Status: She is alert and oriented to person, place, and time.  Psychiatric:        Mood and Affect: Mood normal.        Behavior: Behavior normal.     Lab Results  Component Value Date   WBC 10.7 09/14/2019   HGB 12.5 09/14/2019   HCT 39.6 09/14/2019   PLT 312 09/14/2019   GLUCOSE 101 (H) 09/14/2019   CHOL 119 09/14/2019   TRIG 90 09/14/2019   HDL 74 09/14/2019   LDLCALC 28 09/14/2019   ALT 16 09/14/2019   AST 13 09/14/2019   NA 133 (L) 09/14/2019   K 4.8 09/14/2019   CL 96 09/14/2019   CREATININE 1.10 (H) 09/14/2019   BUN 16 09/14/2019   CO2 21 09/14/2019   TSH 1.270 09/14/2019   HGBA1C 5.9 (H) 09/14/2019   MICROALBUR 30 06/16/2019      Assessment & Plan:  1. Essential hypertension Well controlled.  No changes to medicines.  Continue to work on eating a healthy diet and exercise.  Labs drawn today.  - Comprehensive metabolic panel - CBC with Differential/Platelet  2. Mixed hyperlipidemia Well controlled.  No changes to medicines.  Continue to work on eating a healthy diet and exercise.  Labs drawn today.  - Lipid panel  3. Combined hyperlipidemia associated with type 2 diabetes mellitus (HCC) Control: good Recommend check sugars fasting daily. Recommend check feet daily. Recommend annual eye  exams. Medicines: no changes. Continue to work on eating a healthy diet and exercise.  Labs drawn today.   - A1C.  4.  Acquired hypothyroidism The current medical regimen is effective;  continue present plan and medications.  5. Class 1 obesity due to excess calories with serious comorbidity and body mass index (BMI) of 32 in adult [facility]) Recommend continue to work on eating healthy diet and exercise.  6. Diarrhea (since GB removed.) Start on cholestyramine.   Follow-up: Return in about 3 months (around 12/15/2019) for fasting.  An After Visit Summary was printed and given to the patient.  Blane Ohara [facility] [phone]

## 2019-09-13 DIAGNOSIS — M7062 Trochanteric bursitis, left hip: Secondary | ICD-10-CM | POA: Insufficient documentation

## 2019-09-13 DIAGNOSIS — M7061 Trochanteric bursitis, right hip: Secondary | ICD-10-CM | POA: Insufficient documentation

## 2019-09-14 ENCOUNTER — Ambulatory Visit (INDEPENDENT_AMBULATORY_CARE_PROVIDER_SITE_OTHER): Payer: Medicare Other | Admitting: Family Medicine

## 2019-09-14 ENCOUNTER — Other Ambulatory Visit: Payer: Self-pay

## 2019-09-14 ENCOUNTER — Encounter: Payer: Self-pay | Admitting: Family Medicine

## 2019-09-14 VITALS — BP 124/64 | HR 63 | Temp 97.4°F | Ht 63.0 in | Wt 183.0 lb

## 2019-09-14 DIAGNOSIS — E782 Mixed hyperlipidemia: Secondary | ICD-10-CM | POA: Diagnosis not present

## 2019-09-14 DIAGNOSIS — Z6832 Body mass index (BMI) 32.0-32.9, adult: Secondary | ICD-10-CM | POA: Diagnosis not present

## 2019-09-14 DIAGNOSIS — E039 Hypothyroidism, unspecified: Secondary | ICD-10-CM

## 2019-09-14 DIAGNOSIS — I10 Essential (primary) hypertension: Secondary | ICD-10-CM | POA: Diagnosis not present

## 2019-09-14 DIAGNOSIS — E6609 Other obesity due to excess calories: Secondary | ICD-10-CM | POA: Diagnosis not present

## 2019-09-14 DIAGNOSIS — E1169 Type 2 diabetes mellitus with other specified complication: Secondary | ICD-10-CM

## 2019-09-14 DIAGNOSIS — K909 Intestinal malabsorption, unspecified: Secondary | ICD-10-CM

## 2019-09-14 DIAGNOSIS — R197 Diarrhea, unspecified: Secondary | ICD-10-CM

## 2019-09-14 MED ORDER — FREESTYLE LANCETS MISC: 3 refills | Status: DC

## 2019-09-14 MED ORDER — FREESTYLE LANCETS MISC: 3 refills | Status: AC

## 2019-09-14 MED ORDER — CHOLESTYRAMINE 4 G PO PACK: 4.0000 g | PACK | Freq: Three times a day (TID) | ORAL | 3 refills | Status: DC

## 2019-09-15 LAB — COMPREHENSIVE METABOLIC PANEL
ALT: 16 IU/L (ref 0–32)
AST: 13 IU/L (ref 0–40)
Albumin/Globulin Ratio: 1.7 (ref 1.2–2.2)
Albumin: 4.4 g/dL (ref 3.8–4.8)
Alkaline Phosphatase: 95 IU/L (ref 48–121)
BUN/Creatinine Ratio: 15 (ref 12–28)
BUN: 16 mg/dL (ref 8–27)
Bilirubin Total: 0.4 mg/dL (ref 0.0–1.2)
CO2: 21 mmol/L (ref 20–29)
Calcium: 10 mg/dL (ref 8.7–10.3)
Chloride: 96 mmol/L (ref 96–106)
Creatinine, Ser: 1.1 mg/dL — ABNORMAL HIGH (ref 0.57–1.00)
GFR calc Af Amer: 60 mL/min/{1.73_m2} (ref >59)
GFR calc non Af Amer: 52 mL/min/{1.73_m2} — ABNORMAL LOW (ref >59)
Globulin, Total: 2.6 g/dL (ref 1.5–4.5)
Glucose: 101 mg/dL — ABNORMAL HIGH (ref 65–99)
Potassium: 4.8 mmol/L (ref 3.5–5.2)
Sodium: 133 mmol/L — ABNORMAL LOW (ref 134–144)
Total Protein: 7 g/dL (ref 6.0–8.5)

## 2019-09-15 LAB — CBC WITH DIFFERENTIAL/PLATELET
Basophils Absolute: 0.1 10*3/uL (ref 0.0–0.2)
Basos: 1 %
EOS (ABSOLUTE): 0.2 10*3/uL (ref 0.0–0.4)
Eos: 2 %
Hematocrit: 39.6 % (ref 34.0–46.6)
Hemoglobin: 12.5 g/dL (ref 11.1–15.9)
Immature Grans (Abs): 0.1 10*3/uL (ref 0.0–0.1)
Immature Granulocytes: 1 %
Lymphocytes Absolute: 1.8 10*3/uL (ref 0.7–3.1)
Lymphs: 16 %
MCH: 25.9 pg — ABNORMAL LOW (ref 26.6–33.0)
MCHC: 31.6 g/dL (ref 31.5–35.7)
MCV: 82 fL (ref 79–97)
Monocytes Absolute: 0.9 10*3/uL (ref 0.1–0.9)
Monocytes: 9 %
Neutrophils Absolute: 7.7 10*3/uL — ABNORMAL HIGH (ref 1.4–7.0)
Neutrophils: 71 %
Platelets: 312 10*3/uL (ref 150–450)
RBC: 4.83 x10E6/uL (ref 3.77–5.28)
RDW: 17 % — ABNORMAL HIGH (ref 11.7–15.4)
WBC: 10.7 10*3/uL (ref 3.4–10.8)

## 2019-09-15 LAB — LIPID PANEL
Chol/HDL Ratio: 1.6 ratio (ref 0.0–4.4)
Cholesterol, Total: 119 mg/dL (ref 100–199)
HDL: 74 mg/dL (ref >39)
LDL Chol Calc (NIH): 28 mg/dL (ref 0–99)
Triglycerides: 90 mg/dL (ref 0–149)
VLDL Cholesterol Cal: 17 mg/dL (ref 5–40)

## 2019-09-15 LAB — HEMOGLOBIN A1C
Est. average glucose Bld gHb Est-mCnc: 123 mg/dL
Hgb A1c MFr Bld: 5.9 % — ABNORMAL HIGH (ref 4.8–5.6)

## 2019-09-15 LAB — CARDIOVASCULAR RISK ASSESSMENT

## 2019-09-15 LAB — TSH: TSH: 1.27 u[IU]/mL (ref 0.450–4.500)

## 2019-09-17 ENCOUNTER — Other Ambulatory Visit: Payer: Self-pay

## 2019-09-17 DIAGNOSIS — E1169 Type 2 diabetes mellitus with other specified complication: Secondary | ICD-10-CM

## 2019-09-19 ENCOUNTER — Other Ambulatory Visit: Payer: Self-pay

## 2019-09-19 ENCOUNTER — Encounter: Payer: Self-pay | Admitting: Family Medicine

## 2019-09-19 DIAGNOSIS — K909 Intestinal malabsorption, unspecified: Secondary | ICD-10-CM

## 2019-09-20 MED ORDER — CHOLESTYRAMINE 4 G PO PACK: 4.0000 g | PACK | Freq: Three times a day (TID) | ORAL | 3 refills | Status: DC

## 2019-09-20 MED ORDER — LEVOTHYROXINE SODIUM 50 MCG PO TABS: 50.0000 ug | ORAL_TABLET | Freq: Every day | ORAL | 0 refills | Status: DC

## 2019-09-21 ENCOUNTER — Other Ambulatory Visit: Payer: Self-pay

## 2019-09-21 DIAGNOSIS — K909 Intestinal malabsorption, unspecified: Secondary | ICD-10-CM

## 2019-09-21 DIAGNOSIS — R197 Diarrhea, unspecified: Secondary | ICD-10-CM

## 2019-09-21 MED ORDER — CHOLESTYRAMINE 4 G PO PACK: 4.0000 g | PACK | Freq: Three times a day (TID) | ORAL | 3 refills | Status: AC

## 2019-09-21 MED ORDER — LEVOTHYROXINE SODIUM 50 MCG PO TABS: 50.0000 ug | ORAL_TABLET | Freq: Every day | ORAL | 0 refills | Status: DC

## 2019-10-01 DIAGNOSIS — H2513 Age-related nuclear cataract, bilateral: Secondary | ICD-10-CM | POA: Diagnosis not present

## 2019-10-01 DIAGNOSIS — H5203 Hypermetropia, bilateral: Secondary | ICD-10-CM | POA: Diagnosis not present

## 2019-10-01 DIAGNOSIS — H34232 Retinal artery branch occlusion, left eye: Secondary | ICD-10-CM | POA: Diagnosis not present

## 2019-10-15 DIAGNOSIS — H2513 Age-related nuclear cataract, bilateral: Secondary | ICD-10-CM | POA: Diagnosis not present

## 2019-10-15 DIAGNOSIS — H5203 Hypermetropia, bilateral: Secondary | ICD-10-CM | POA: Diagnosis not present

## 2019-10-15 DIAGNOSIS — H34232 Retinal artery branch occlusion, left eye: Secondary | ICD-10-CM | POA: Diagnosis not present

## 2019-10-19 DIAGNOSIS — H349 Unspecified retinal vascular occlusion: Secondary | ICD-10-CM | POA: Insufficient documentation

## 2019-10-19 DIAGNOSIS — I252 Old myocardial infarction: Secondary | ICD-10-CM | POA: Diagnosis not present

## 2019-10-19 DIAGNOSIS — I10 Essential (primary) hypertension: Secondary | ICD-10-CM | POA: Diagnosis not present

## 2019-10-19 DIAGNOSIS — I2510 Atherosclerotic heart disease of native coronary artery without angina pectoris: Secondary | ICD-10-CM | POA: Diagnosis not present

## 2019-10-19 DIAGNOSIS — E782 Mixed hyperlipidemia: Secondary | ICD-10-CM | POA: Diagnosis not present

## 2019-10-29 ENCOUNTER — Ambulatory Visit (INDEPENDENT_AMBULATORY_CARE_PROVIDER_SITE_OTHER): Payer: Medicare Other

## 2019-10-29 DIAGNOSIS — Z23 Encounter for immunization: Secondary | ICD-10-CM | POA: Diagnosis not present

## 2019-10-29 NOTE — Progress Notes (Signed)
° ° °  Patient Name: Megan Aguilar Patient DOB: [birth year]  10/29/2019  Megan Aguilar came in today for her 2021-2022 influenza vaccine.  Patient tolerated it well.   [name]

## 2019-11-12 DIAGNOSIS — I63512 Cerebral infarction due to unspecified occlusion or stenosis of left middle cerebral artery: Secondary | ICD-10-CM | POA: Diagnosis not present

## 2019-11-12 DIAGNOSIS — H349 Unspecified retinal vascular occlusion: Secondary | ICD-10-CM | POA: Diagnosis not present

## 2019-11-19 DIAGNOSIS — H34232 Retinal artery branch occlusion, left eye: Secondary | ICD-10-CM | POA: Diagnosis not present

## 2019-11-19 DIAGNOSIS — H53452 Other localized visual field defect, left eye: Secondary | ICD-10-CM | POA: Diagnosis not present

## 2019-11-19 DIAGNOSIS — I252 Old myocardial infarction: Secondary | ICD-10-CM | POA: Diagnosis not present

## 2019-11-19 DIAGNOSIS — E7800 Pure hypercholesterolemia, unspecified: Secondary | ICD-10-CM | POA: Diagnosis not present

## 2019-11-22 ENCOUNTER — Other Ambulatory Visit: Payer: Self-pay

## 2019-11-22 MED ORDER — METFORMIN HCL 500 MG PO TABS: 500.0000 mg | ORAL_TABLET | Freq: Two times a day (BID) | ORAL | 0 refills | Status: DC

## 2019-11-25 ENCOUNTER — Other Ambulatory Visit: Payer: Self-pay | Admitting: Family Medicine

## 2019-11-25 DIAGNOSIS — I10 Essential (primary) hypertension: Secondary | ICD-10-CM

## 2019-11-26 ENCOUNTER — Other Ambulatory Visit: Payer: Self-pay

## 2019-11-26 MED ORDER — METFORMIN HCL 500 MG PO TABS: 500.0000 mg | ORAL_TABLET | Freq: Two times a day (BID) | ORAL | 0 refills | Status: DC

## 2019-11-28 ENCOUNTER — Other Ambulatory Visit: Payer: Self-pay

## 2019-11-28 MED ORDER — METFORMIN HCL 500 MG PO TABS: 500.0000 mg | ORAL_TABLET | Freq: Two times a day (BID) | ORAL | 0 refills | Status: AC

## 2019-11-29 DIAGNOSIS — L300 Nummular dermatitis: Secondary | ICD-10-CM | POA: Diagnosis not present

## 2019-12-13 ENCOUNTER — Other Ambulatory Visit: Payer: Self-pay | Admitting: Family Medicine

## 2019-12-17 DIAGNOSIS — M7062 Trochanteric bursitis, left hip: Secondary | ICD-10-CM | POA: Diagnosis not present

## 2019-12-17 DIAGNOSIS — Z96642 Presence of left artificial hip joint: Secondary | ICD-10-CM | POA: Diagnosis not present

## 2019-12-17 DIAGNOSIS — M7061 Trochanteric bursitis, right hip: Secondary | ICD-10-CM | POA: Diagnosis not present

## 2019-12-17 DIAGNOSIS — Z96643 Presence of artificial hip joint, bilateral: Secondary | ICD-10-CM | POA: Diagnosis not present

## 2019-12-18 DIAGNOSIS — Z96643 Presence of artificial hip joint, bilateral: Secondary | ICD-10-CM | POA: Diagnosis not present

## 2019-12-18 DIAGNOSIS — M7062 Trochanteric bursitis, left hip: Secondary | ICD-10-CM | POA: Diagnosis not present

## 2019-12-18 DIAGNOSIS — M7061 Trochanteric bursitis, right hip: Secondary | ICD-10-CM | POA: Diagnosis not present

## 2019-12-19 ENCOUNTER — Ambulatory Visit (INDEPENDENT_AMBULATORY_CARE_PROVIDER_SITE_OTHER): Payer: Medicare Other | Admitting: Family Medicine

## 2019-12-19 ENCOUNTER — Other Ambulatory Visit: Payer: Self-pay

## 2019-12-19 ENCOUNTER — Ambulatory Visit: Payer: Medicare Other | Admitting: Family Medicine

## 2019-12-19 ENCOUNTER — Encounter: Payer: Self-pay | Admitting: Family Medicine

## 2019-12-19 VITALS — BP 120/68 | HR 64 | Temp 98.3°F | Ht 63.0 in | Wt 183.6 lb

## 2019-12-19 DIAGNOSIS — E782 Mixed hyperlipidemia: Secondary | ICD-10-CM | POA: Diagnosis not present

## 2019-12-19 DIAGNOSIS — E1169 Type 2 diabetes mellitus with other specified complication: Secondary | ICD-10-CM

## 2019-12-19 DIAGNOSIS — F4321 Adjustment disorder with depressed mood: Secondary | ICD-10-CM

## 2019-12-19 DIAGNOSIS — D649 Anemia, unspecified: Secondary | ICD-10-CM | POA: Diagnosis not present

## 2019-12-19 DIAGNOSIS — H349 Unspecified retinal vascular occlusion: Secondary | ICD-10-CM | POA: Diagnosis not present

## 2019-12-19 DIAGNOSIS — E7800 Pure hypercholesterolemia, unspecified: Secondary | ICD-10-CM | POA: Insufficient documentation

## 2019-12-19 DIAGNOSIS — Z8673 Personal history of transient ischemic attack (TIA), and cerebral infarction without residual deficits: Secondary | ICD-10-CM | POA: Diagnosis not present

## 2019-12-19 DIAGNOSIS — K219 Gastro-esophageal reflux disease without esophagitis: Secondary | ICD-10-CM | POA: Insufficient documentation

## 2019-12-19 DIAGNOSIS — I10 Essential (primary) hypertension: Secondary | ICD-10-CM

## 2019-12-19 DIAGNOSIS — S93401A Sprain of unspecified ligament of right ankle, initial encounter: Secondary | ICD-10-CM | POA: Diagnosis not present

## 2019-12-19 DIAGNOSIS — I252 Old myocardial infarction: Secondary | ICD-10-CM | POA: Insufficient documentation

## 2019-12-19 NOTE — Progress Notes (Signed)
Established Patient Office Visit  Subjective:  Patient ID: Megan Aguilar, female    DOB: [birth year]  Age: 69 y.o. MRN: [id number]  CC:  Chief Complaint  Patient presents with  . Hypertension    follow up    HPI Megan Aguilar presents for HTN DM-A1c 5.9% CVA-1/21 Vascular blockage-eye-[facility] following MI-02/10/19-Plavix/asa 10/20-hip surgery HTN-amlodipine 5mg  daily [pts husband died in 5/21-pancreas/liver-grief reaction-going to group at church for group therapy Right ankle sprain-yesterday-mowing the grass-able to walk. Hurt finger on lawn mower-smash injury Past Medical History:  Diagnosis Date  . Adult acne   . Arthritis    "all over" (02/24/2018)  . Chronic bronchitis (HCC)   . Diabetes (HCC)   . GERD (gastroesophageal reflux disease)   . Headache    "at least 1/wk" (02/24/2018)  . Heart murmur    AS A 69 YR OLD...HASN'T BEEN A PROBLEM SINCE THEN.   . High cholesterol   . Hypertension   . Hypothyroidism     Past Surgical History:  Procedure Laterality Date  . ANTERIOR / POSTERIOR COMBINED FUSION CERVICAL SPINE  02/24/2018   C3-4; C7-T1  . ANTERIOR CERVICAL DECOMP/DISCECTOMY FUSION  2006  . ANTERIOR CERVICAL DECOMP/DISCECTOMY FUSION N/A 02/24/2018   Procedure: Anterior Cervical Decompression Fusion  - Cervical three-Cervical four;  Surgeon: 04/25/2018, MD;  Location: [facility] OR;  Service: Neurosurgery;  Laterality: N/A;  . BACK SURGERY    . JOINT REPLACEMENT    . LAPAROSCOPIC CHOLECYSTECTOMY    . LEFT HEART CATH AND CORONARY ANGIOGRAPHY N/A 02/12/2019   Procedure: LEFT HEART CATH AND CORONARY ANGIOGRAPHY;  Surgeon: 02/14/2019, MD;  Location: [facility];  Service: Cardiovascular;  Laterality: N/A;  . POSTERIOR CERVICAL FUSION/FORAMINOTOMY N/A 02/24/2018   Procedure: Posterior cervical decompression and lateral mass fusion Cervical seven -Thoracic one;  Surgeon: 04/25/2018, MD;  Location: [facility] OR;  Service: Neurosurgery;  Laterality: N/A;  . TOTAL  ABDOMINAL HYSTERECTOMY  2017  . TOTAL HIP ARTHROPLASTY Right 07/2017  . TUBAL LIGATION  1977    Family History  Problem Relation Age of Onset  . Diabetes Mother   . COPD Father   . Aortic stenosis Father   . Alzheimer's disease Father   . Congestive Heart Failure Father   . Diabetes Sister   . Lung cancer Brother   . Hyperlipidemia Brother   . Hypertension Brother     Social History   Socioeconomic History  . Marital status: Married    Spouse name: Not on file  . Number of children: 2  . Years of education: Not on file  . Highest education level: Not on file  Occupational History  . Not on file  Tobacco Use  . Smoking status: Former Smoker    Packs/day: 2.00    Years: 30.00    Pack years: 60.00    Types: Cigarettes    Quit date: 02/21/2004    Years since quitting: 15.8  . Smokeless tobacco: Never Used  Vaping Use  . Vaping Use: Never used  Substance and Sexual Activity  . Alcohol use: Never  . Drug use: Never  . Sexual activity: Not Currently  Other Topics Concern  . Not on file  Social History Narrative  . Not on file   Social Determinants of Health   Financial Resource Strain:   . Difficulty of Paying Living Expenses: Not on file  Food Insecurity:   . Worried About 04/20/2004 in the Last Year: Not on  file  . Ran Out of Food in the Last Year: Not on file  Transportation Needs:   . Lack of Transportation (Medical): Not on file  . Lack of Transportation (Non-Medical): Not on file  Physical Activity:   . Days of Exercise per Week: Not on file  . Minutes of Exercise per Session: Not on file  Stress:   . Feeling of Stress : Not on file  Social Connections:   . Frequency of Communication with Friends and Family: Not on file  . Frequency of Social Gatherings with Friends and Family: Not on file  . Attends Religious Services: Not on file  . Active Member of Clubs or Organizations: Not on file  . Attends Banker Meetings: Not on file  .  Marital Status: Not on file  Intimate Partner Violence:   . Fear of Current or Ex-Partner: Not on file  . Emotionally Abused: Not on file  . Physically Abused: Not on file  . Sexually Abused: Not on file    Outpatient Medications Prior to Visit  Medication Sig Dispense Refill  . amLODipine (NORVASC) 5 MG tablet TAKE 1 TABLET BY MOUTH EVERY DAY 90 tablet 1  . aspirin 81 MG EC tablet Take 81 mg by mouth daily.     . B Complex-C (B-COMPLEX WITH VITAMIN C) tablet Take 1 tablet by mouth daily.    Claris Gower Grape-Goldenseal (BERBERINE COMPLEX PO) Take 1 capsule by mouth daily.    . Calcium Carb-Cholecalciferol (CALCIUM 600+D) 600-800 MG-UNIT TABS Take 1 tablet by mouth daily.    . cholestyramine (QUESTRAN) 4 g packet Take 1 packet (4 g total) by mouth 3 (three) times daily with meals. 90 each 3  . clopidogrel (PLAVIX) 75 MG tablet Take 1 tablet (75 mg total) by mouth daily. 90 tablet 2  . Coenzyme Q10 (CO Q-10) 200 MG CAPS Take 200 mg by mouth daily.    . Cyanocobalamin (VITAMIN B-12 PO) Take 5,000 mcg by mouth daily.    . fexofenadine (ALLEGRA) 180 MG tablet Take 180 mg by mouth at bedtime.    . fluticasone (FLONASE) 50 MCG/ACT nasal spray PLACE 2 SPRAYS INTO BOTH NOSTRILS DAILY AS NEEDED FOR ALLERGIES OR RHINITIS. 48 mL 1  . FREESTYLE LITE test strip USE TO TEST SUGAR DAILY    . gabapentin (NEURONTIN) 300 MG capsule Take 300 mg by mouth daily.     . Glucosamine-Chondroit-Vit C-Mn (GLUCOSAMINE 1500 COMPLEX PO) Take 3,000 mg by mouth daily.    . isosorbide mononitrate (IMDUR) 30 MG 24 hr tablet TAKE 1 TABLET BY MOUTH EVERY MORNING 90 tablet 0  . ketoconazole (NIZORAL) 2 % shampoo Apply 1 application topically See admin instructions. 2 to 3 times a week    . Lancets (FREESTYLE) lancets Use as instructed. Check blood sugar one time daily, use new lancet each time. 100 each 3  . levothyroxine (SYNTHROID) 50 MCG tablet TAKE 1 TABLET BY MOUTH EVERY DAY 90 tablet 0  . magnesium oxide (MAG-OX)  400 MG tablet Take 800 mg by mouth daily.    . metFORMIN (GLUCOPHAGE) 500 MG tablet Take 1 tablet (500 mg total) by mouth 2 (two) times daily with a meal. 180 tablet 0  . nitroGLYCERIN (NITROSTAT) 0.4 MG SL tablet Place 1 tablet (0.4 mg total) under the tongue every 5 (five) minutes as needed for chest pain. 25 tablet 1  . olmesartan (BENICAR) 20 MG tablet TAKE 1 TABLET BY MOUTH EVERY DAY 30 tablet 11  . Omega-3  Fatty Acids (FISH OIL) 1200 MG CAPS Take 1,200 mg by mouth daily.    . RABEprazole (ACIPHEX) 20 MG tablet Take 1 tablet (20 mg total) by mouth daily. 90 tablet 3  . rosuvastatin (CRESTOR) 20 MG tablet Take 1 tablet (20 mg total) by mouth daily. 90 tablet 3  . tiZANidine (ZANAFLEX) 4 MG tablet Take 4 mg by mouth at bedtime.     . traZODone (DESYREL) 50 MG tablet Take 1 tablet (50 mg total) by mouth at bedtime as needed for sleep. 30 tablet 3  . TURMERIC PO Take 538 mg by mouth daily.    . Turmeric POWD Take by mouth.     No facility-administered medications prior to visit.    Allergies  Allergen Reactions  . Banana Other (See Comments)    Upset stomach   . Cantaloupe (Diagnostic) Other (See Comments)    Upset stomach   . Cantaloupe Extract Allergy Skin Test Other (See Comments)    Stomach upset  . Celebrex [Celecoxib] Swelling  . Eggs Or Egg-Derived Products Other (See Comments)    Upset stomach  Stomach upset  . Mobic [Meloxicam] Swelling  . Oxycodone Hcl Itching  . Pineapple Other (See Comments)    Upset stomach    ROS Review of Systems  Constitutional: Negative for fatigue and fever.  HENT: Negative.   Eyes:       Left eye-vision loss-followed by [facility]  Respiratory: Negative.   Cardiovascular:       MI  Gastrointestinal: Negative.   Endocrine:       DM  Musculoskeletal:       Sprained ankle  Allergic/Immunologic: Negative.   Neurological: Positive for headaches.       CV  Psychiatric/Behavioral:       Grief reaction-recent loss of husband       Objective:    Physical Exam Constitutional:      Appearance: Normal appearance.  HENT:     Head: Normocephalic and atraumatic.  Cardiovascular:     Rate and Rhythm: Normal rate and regular rhythm.     Pulses: Normal pulses.     Heart sounds: Normal heart sounds.  Pulmonary:     Effort: Pulmonary effort is normal.     Breath sounds: Normal breath sounds.  Neurological:     Mental Status: She is alert and oriented to person, place, and time.  Psychiatric:        Aguilar and Affect: Aguilar normal.     BP 120/68 (BP Location: Left Arm, Patient Position: Sitting, Cuff Size: Normal)   Pulse 64   Temp 98.3 F (36.8 C) (Temporal)   Ht 5\' 3"  (1.6 m)   Wt 183 lb 9.6 oz (83.3 kg)   SpO2 98%   BMI 32.52 kg/m  Wt Readings from Last 3 Encounters:  12/19/19 183 lb 9.6 oz (83.3 kg)  09/14/19 183 lb (83 kg)  08/23/19 189 lb 6.4 oz (85.9 kg)     Health Maintenance Due  Topic Date Due  . Hepatitis C Screening  Never done  . FOOT EXAM  Never done  . COVID-19 Vaccine (1) Never done  . MAMMOGRAM  12/01/2019    There are no preventive care reminders to display for this patient.  Lab Results  Component Value Date   TSH 1.270 09/14/2019   Lab Results  Component Value Date   WBC 10.7 09/14/2019   HGB 12.5 09/14/2019   HCT 39.6 09/14/2019   MCV 82 09/14/2019   PLT 312  09/14/2019   Lab Results  Component Value Date   NA 133 (L) 09/14/2019   K 4.8 09/14/2019   CO2 21 09/14/2019   GLUCOSE 101 (H) 09/14/2019   BUN 16 09/14/2019   CREATININE 1.10 (H) 09/14/2019   BILITOT 0.4 09/14/2019   ALKPHOS 95 09/14/2019   AST 13 09/14/2019   ALT 16 09/14/2019   PROT 7.0 09/14/2019   ALBUMIN 4.4 09/14/2019   CALCIUM 10.0 09/14/2019   ANIONGAP 10 02/11/2019   Lab Results  Component Value Date   CHOL 119 09/14/2019   Lab Results  Component Value Date   HDL 74 09/14/2019   Lab Results  Component Value Date   LDLCALC 28 09/14/2019   Lab Results  Component Value Date   TRIG 90  09/14/2019   Lab Results  Component Value Date   CHOLHDL 1.6 09/14/2019   Lab Results  Component Value Date   HGBA1C 5.9 (H) 09/14/2019      Assessment & Plan:  1. Feeling grief Attending a group meeting at church weekly Good family support Trazodone-helps with anxiety 2. Combined hyperlipidemia associated with type 2 diabetes mellitus (HCC) Crestor 20mg  Cholestyramine 3. Essential hypertension Amlodipine daily-followed by vascular and cardiology-MI/CVA/retinal occlusion isosorbide 4. DM type 2 with diabetic mixed hyperlipidemia (HCC) Metformin-7/21-5.9%  5. 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 GERD-Aciphex-stable  8. Right ankle sprain-Ace wrap support for 6 weeks-avoid uneven ground  Follow-up: 3 months  Kaeya Schiffer Mat CarneLEIGH Chukwudi Ewen, MD

## 2019-12-19 NOTE — Patient Instructions (Addendum)
Follow up in 3 months  Ankle Sprain  An ankle sprain is a stretch or tear in one of the tough tissues (ligaments) that connect the bones in your ankle. An ankle sprain can happen when the ankle rolls outward (inversion sprain) or inward (eversion sprain). What are the causes? This condition is caused by rolling or twisting the ankle. What increases the risk? You are more likely to develop this condition if you play sports. What are the signs or symptoms? Symptoms of this condition include:  Pain in your ankle.  Swelling.  Bruising. This may happen right after you sprain your ankle or 1-2 days later.  Trouble standing or walking. How is this diagnosed? This condition is diagnosed with:  A physical exam. During the exam, your doctor will press on certain parts of your foot and ankle and try to move them in certain ways.  X-ray imaging. These may be taken to see how bad the sprain is and to check for broken bones. How is this treated? This condition may be treated with:  A brace or splint. This is used to keep the ankle from moving until it heals.  An elastic bandage. This is used to support the ankle.  Crutches.  Pain medicine.  Surgery. This may be needed if the sprain is very bad.  Physical therapy. This may help to improve movement in the ankle. Follow these instructions at home: If you have a brace or a splint:  Wear the brace or splint as told by your doctor. Remove it only as told by your doctor.  Loosen the brace or splint if your toes: ? Tingle. ? Lose feeling (become numb). ? Turn cold and blue.  Keep the brace or splint clean.  If the brace or splint is not waterproof: ? Do not let it get wet. ? Cover it with a watertight covering when you take a bath or a shower. If you have an elastic bandage (dressing):  Remove it to shower or bathe.  Try not to move your ankle much, but wiggle your toes from time to time. This helps to prevent swelling.  Adjust  the dressing if it feels too tight.  Loosen the dressing if your foot: ? Loses feeling. ? Tingles. ? Becomes cold and blue. Managing pain, stiffness, and swelling   Take over-the-counter and prescription medicines only as told by doctor.  For 2-3 days, keep your ankle raised (elevated) above the level of your heart.  If told, put ice on the injured area: ? If you have a removable brace or splint, remove it as told by your doctor. ? Put ice in a plastic bag. ? Place a towel between your skin and the bag. ? Leave the ice on for 20 minutes, 2-3 times a day. General instructions  Rest your ankle.  Do not use your injured leg to support your body weight until your doctor says that you can. Use crutches as told by your doctor.  Do not use any products that contain nicotine or tobacco, such as cigarettes, e-cigarettes, and chewing tobacco. If you need help quitting, ask your doctor.  Keep all follow-up visits as told by your doctor. Contact a doctor if:  Your bruises or swelling are quickly getting worse.  Your pain does not get better after you take medicine. Get help right away if:  You cannot feel your toes or foot.  Your foot or toes look blue.  You have very bad pain that gets worse. Summary  An ankle sprain is a stretch or tear in one of the tough tissues (ligaments) that connect the bones in your ankle.  This condition is caused by rolling or twisting the ankle.  Symptoms include pain, swelling, bruising, and trouble walking.  To help with pain and swelling, put ice on the injured ankle, raise your ankle above the level of your heart, and use an elastic bandage. Also, rest as told by your doctor.  Keep all follow-up visits as told by your doctor. This is important. This information is not intended to replace advice given to you by your health care provider. Make sure you discuss any questions you have with your health care provider. Document Revised: 06/28/2017  Document Reviewed: 06/28/2017 Elsevier Patient Education  2020 [employer].

## 2019-12-20 LAB — CBC WITH DIFFERENTIAL/PLATELET
Basophils Absolute: 0 10*3/uL (ref 0.0–0.2)
Basos: 0 %
EOS (ABSOLUTE): 0 10*3/uL (ref 0.0–0.4)
Eos: 0 %
Hematocrit: 35.3 % (ref 34.0–46.6)
Hemoglobin: 10.8 g/dL — ABNORMAL LOW (ref 11.1–15.9)
Immature Grans (Abs): 0 10*3/uL (ref 0.0–0.1)
Immature Granulocytes: 0 %
Lymphocytes Absolute: 1.3 10*3/uL (ref 0.7–3.1)
Lymphs: 14 %
MCH: 24.9 pg — ABNORMAL LOW (ref 26.6–33.0)
MCHC: 30.6 g/dL — ABNORMAL LOW (ref 31.5–35.7)
MCV: 81 fL (ref 79–97)
Monocytes Absolute: 0.8 10*3/uL (ref 0.1–0.9)
Monocytes: 8 %
Neutrophils Absolute: 7.1 10*3/uL — ABNORMAL HIGH (ref 1.4–7.0)
Neutrophils: 78 %
Platelets: 306 10*3/uL (ref 150–450)
RBC: 4.34 x10E6/uL (ref 3.77–5.28)
RDW: 15.9 % — ABNORMAL HIGH (ref 11.7–15.4)
WBC: 9.2 10*3/uL (ref 3.4–10.8)

## 2019-12-20 LAB — CMP14+EGFR
ALT: 31 IU/L (ref 0–32)
AST: 27 IU/L (ref 0–40)
Albumin/Globulin Ratio: 1.7 (ref 1.2–2.2)
Albumin: 4.5 g/dL (ref 3.8–4.8)
Alkaline Phosphatase: 123 IU/L — ABNORMAL HIGH (ref 44–121)
BUN/Creatinine Ratio: 17 (ref 12–28)
BUN: 20 mg/dL (ref 8–27)
Bilirubin Total: 0.3 mg/dL (ref 0.0–1.2)
CO2: 21 mmol/L (ref 20–29)
Calcium: 10.2 mg/dL (ref 8.7–10.3)
Chloride: 101 mmol/L (ref 96–106)
Creatinine, Ser: 1.17 mg/dL — ABNORMAL HIGH (ref 0.57–1.00)
GFR calc Af Amer: 55 mL/min/{1.73_m2} — ABNORMAL LOW (ref >59)
GFR calc non Af Amer: 48 mL/min/{1.73_m2} — ABNORMAL LOW (ref >59)
Globulin, Total: 2.6 g/dL (ref 1.5–4.5)
Glucose: 114 mg/dL — ABNORMAL HIGH (ref 65–99)
Potassium: 5 mmol/L (ref 3.5–5.2)
Sodium: 136 mmol/L (ref 134–144)
Total Protein: 7.1 g/dL (ref 6.0–8.5)

## 2019-12-20 LAB — HEMOGLOBIN A1C
Est. average glucose Bld gHb Est-mCnc: 126 mg/dL
Hgb A1c MFr Bld: 6 % — ABNORMAL HIGH (ref 4.8–5.6)

## 2019-12-24 DIAGNOSIS — D649 Anemia, unspecified: Secondary | ICD-10-CM | POA: Insufficient documentation

## 2019-12-24 DIAGNOSIS — H34232 Retinal artery branch occlusion, left eye: Secondary | ICD-10-CM | POA: Diagnosis not present

## 2019-12-24 DIAGNOSIS — H2513 Age-related nuclear cataract, bilateral: Secondary | ICD-10-CM | POA: Diagnosis not present

## 2019-12-27 ENCOUNTER — Encounter: Payer: Self-pay | Admitting: Family Medicine

## 2019-12-27 DIAGNOSIS — Z1231 Encounter for screening mammogram for malignant neoplasm of breast: Secondary | ICD-10-CM | POA: Diagnosis not present

## 2020-01-04 DIAGNOSIS — I252 Old myocardial infarction: Secondary | ICD-10-CM | POA: Diagnosis not present

## 2020-01-04 DIAGNOSIS — I2510 Atherosclerotic heart disease of native coronary artery without angina pectoris: Secondary | ICD-10-CM | POA: Diagnosis not present

## 2020-01-04 DIAGNOSIS — R002 Palpitations: Secondary | ICD-10-CM | POA: Diagnosis not present

## 2020-01-04 DIAGNOSIS — I6523 Occlusion and stenosis of bilateral carotid arteries: Secondary | ICD-10-CM | POA: Diagnosis not present

## 2020-01-04 DIAGNOSIS — I350 Nonrheumatic aortic (valve) stenosis: Secondary | ICD-10-CM | POA: Diagnosis not present

## 2020-01-04 DIAGNOSIS — Z7982 Long term (current) use of aspirin: Secondary | ICD-10-CM | POA: Diagnosis not present

## 2020-01-04 DIAGNOSIS — I10 Essential (primary) hypertension: Secondary | ICD-10-CM | POA: Diagnosis not present

## 2020-01-04 DIAGNOSIS — R609 Edema, unspecified: Secondary | ICD-10-CM | POA: Diagnosis not present

## 2020-01-04 DIAGNOSIS — E782 Mixed hyperlipidemia: Secondary | ICD-10-CM | POA: Diagnosis not present

## 2020-01-04 DIAGNOSIS — H349 Unspecified retinal vascular occlusion: Secondary | ICD-10-CM | POA: Diagnosis not present

## 2020-01-07 ENCOUNTER — Other Ambulatory Visit: Payer: Self-pay

## 2020-01-07 DIAGNOSIS — Z1211 Encounter for screening for malignant neoplasm of colon: Secondary | ICD-10-CM

## 2020-01-07 LAB — POC HEMOCCULT BLD/STL (HOME/3-CARD/SCREEN)
Card #2 Fecal Occult Blod, POC: NEGATIVE
Card #3 Fecal Occult Blood, POC: NEGATIVE
Fecal Occult Blood, POC: NEGATIVE

## 2020-01-07 NOTE — Progress Notes (Signed)
hemo 

## 2020-01-09 DIAGNOSIS — E782 Mixed hyperlipidemia: Secondary | ICD-10-CM | POA: Diagnosis not present

## 2020-01-09 DIAGNOSIS — Z8673 Personal history of transient ischemic attack (TIA), and cerebral infarction without residual deficits: Secondary | ICD-10-CM | POA: Diagnosis not present

## 2020-01-09 DIAGNOSIS — I2510 Atherosclerotic heart disease of native coronary artery without angina pectoris: Secondary | ICD-10-CM | POA: Diagnosis not present

## 2020-01-09 DIAGNOSIS — I350 Nonrheumatic aortic (valve) stenosis: Secondary | ICD-10-CM | POA: Diagnosis not present

## 2020-01-09 DIAGNOSIS — I6523 Occlusion and stenosis of bilateral carotid arteries: Secondary | ICD-10-CM | POA: Diagnosis not present

## 2020-01-09 DIAGNOSIS — I63512 Cerebral infarction due to unspecified occlusion or stenosis of left middle cerebral artery: Secondary | ICD-10-CM | POA: Diagnosis not present

## 2020-01-09 DIAGNOSIS — H349 Unspecified retinal vascular occlusion: Secondary | ICD-10-CM | POA: Diagnosis not present

## 2020-01-09 DIAGNOSIS — R002 Palpitations: Secondary | ICD-10-CM | POA: Diagnosis not present

## 2020-01-09 DIAGNOSIS — I252 Old myocardial infarction: Secondary | ICD-10-CM | POA: Diagnosis not present

## 2020-01-09 DIAGNOSIS — I10 Essential (primary) hypertension: Secondary | ICD-10-CM | POA: Diagnosis not present

## 2020-01-09 DIAGNOSIS — Z7982 Long term (current) use of aspirin: Secondary | ICD-10-CM | POA: Diagnosis not present

## 2020-01-09 DIAGNOSIS — Z981 Arthrodesis status: Secondary | ICD-10-CM | POA: Diagnosis not present

## 2020-01-15 ENCOUNTER — Other Ambulatory Visit: Payer: Self-pay | Admitting: Cardiology

## 2020-01-15 ENCOUNTER — Other Ambulatory Visit: Payer: Self-pay | Admitting: Family Medicine

## 2020-01-15 ENCOUNTER — Other Ambulatory Visit: Payer: Self-pay | Admitting: Physician Assistant

## 2020-01-15 DIAGNOSIS — I10 Essential (primary) hypertension: Secondary | ICD-10-CM

## 2020-01-15 DIAGNOSIS — Z8673 Personal history of transient ischemic attack (TIA), and cerebral infarction without residual deficits: Secondary | ICD-10-CM

## 2020-01-15 LAB — IRON AND TIBC
Iron Saturation: 10 % — ABNORMAL LOW (ref 15–55)
Iron: 46 ug/dL (ref 27–139)
Total Iron Binding Capacity: 473 ug/dL — ABNORMAL HIGH (ref 250–450)
UIBC: 427 ug/dL — ABNORMAL HIGH (ref 118–369)

## 2020-01-15 LAB — B12 AND FOLATE PANEL
Folate: >20 ng/mL (ref >3.0)
Vitamin B-12: >2000 pg/mL — ABNORMAL HIGH (ref 232–1245)

## 2020-01-15 LAB — FERRITIN: Ferritin: 12 ng/mL — ABNORMAL LOW (ref 15–150)

## 2020-01-15 LAB — SPECIMEN STATUS REPORT

## 2020-01-15 MED ORDER — OLMESARTAN MEDOXOMIL 20 MG PO TABS: 20.0000 mg | ORAL_TABLET | Freq: Every day | ORAL | 1 refills | Status: AC

## 2020-01-16 DIAGNOSIS — Z23 Encounter for immunization: Secondary | ICD-10-CM | POA: Diagnosis not present

## 2020-01-28 ENCOUNTER — Other Ambulatory Visit: Payer: Self-pay | Admitting: Family Medicine

## 2020-01-28 DIAGNOSIS — I10 Essential (primary) hypertension: Secondary | ICD-10-CM

## 2020-02-07 DIAGNOSIS — R0981 Nasal congestion: Secondary | ICD-10-CM | POA: Diagnosis not present

## 2020-02-07 DIAGNOSIS — R509 Fever, unspecified: Secondary | ICD-10-CM | POA: Diagnosis not present

## 2020-02-07 DIAGNOSIS — Z20828 Contact with and (suspected) exposure to other viral communicable diseases: Secondary | ICD-10-CM | POA: Diagnosis not present

## 2020-02-26 DIAGNOSIS — Z6831 Body mass index (BMI) 31.0-31.9, adult: Secondary | ICD-10-CM | POA: Diagnosis not present

## 2020-02-26 DIAGNOSIS — I10 Essential (primary) hypertension: Secondary | ICD-10-CM | POA: Diagnosis not present

## 2020-02-26 DIAGNOSIS — E039 Hypothyroidism, unspecified: Secondary | ICD-10-CM | POA: Diagnosis not present

## 2020-02-26 DIAGNOSIS — I2510 Atherosclerotic heart disease of native coronary artery without angina pectoris: Secondary | ICD-10-CM | POA: Diagnosis not present

## 2020-03-04 DIAGNOSIS — I2510 Atherosclerotic heart disease of native coronary artery without angina pectoris: Secondary | ICD-10-CM | POA: Diagnosis not present

## 2020-03-15 ENCOUNTER — Other Ambulatory Visit: Payer: Self-pay | Admitting: Family Medicine

## 2020-03-17 ENCOUNTER — Other Ambulatory Visit: Payer: Self-pay | Admitting: Family Medicine

## 2020-03-24 NOTE — Progress Notes (Signed)
Cancelled. Kc  

## 2020-03-26 ENCOUNTER — Ambulatory Visit (INDEPENDENT_AMBULATORY_CARE_PROVIDER_SITE_OTHER): Payer: Medicare Other | Admitting: Family Medicine

## 2020-03-26 DIAGNOSIS — E782 Mixed hyperlipidemia: Secondary | ICD-10-CM

## 2020-03-26 DIAGNOSIS — E1169 Type 2 diabetes mellitus with other specified complication: Secondary | ICD-10-CM

## 2020-03-26 DIAGNOSIS — I10 Essential (primary) hypertension: Secondary | ICD-10-CM

## 2020-03-31 DIAGNOSIS — I63512 Cerebral infarction due to unspecified occlusion or stenosis of left middle cerebral artery: Secondary | ICD-10-CM | POA: Diagnosis not present

## 2020-04-07 DIAGNOSIS — I10 Essential (primary) hypertension: Secondary | ICD-10-CM | POA: Diagnosis not present

## 2020-04-07 DIAGNOSIS — H349 Unspecified retinal vascular occlusion: Secondary | ICD-10-CM | POA: Diagnosis not present

## 2020-04-07 DIAGNOSIS — E782 Mixed hyperlipidemia: Secondary | ICD-10-CM | POA: Diagnosis not present

## 2020-04-07 DIAGNOSIS — I2510 Atherosclerotic heart disease of native coronary artery without angina pectoris: Secondary | ICD-10-CM | POA: Diagnosis not present

## 2020-04-07 DIAGNOSIS — I350 Nonrheumatic aortic (valve) stenosis: Secondary | ICD-10-CM | POA: Diagnosis not present

## 2020-04-07 DIAGNOSIS — I6523 Occlusion and stenosis of bilateral carotid arteries: Secondary | ICD-10-CM | POA: Diagnosis not present

## 2020-04-07 DIAGNOSIS — I252 Old myocardial infarction: Secondary | ICD-10-CM | POA: Diagnosis not present

## 2020-04-12 ENCOUNTER — Other Ambulatory Visit: Payer: Self-pay | Admitting: Physician Assistant

## 2020-04-15 ENCOUNTER — Ambulatory Visit: Payer: Medicare Other

## 2020-05-02 DIAGNOSIS — Z8673 Personal history of transient ischemic attack (TIA), and cerebral infarction without residual deficits: Secondary | ICD-10-CM | POA: Diagnosis not present

## 2020-05-02 DIAGNOSIS — I10 Essential (primary) hypertension: Secondary | ICD-10-CM | POA: Diagnosis not present

## 2020-05-02 DIAGNOSIS — I252 Old myocardial infarction: Secondary | ICD-10-CM | POA: Diagnosis not present

## 2020-05-02 DIAGNOSIS — I6523 Occlusion and stenosis of bilateral carotid arteries: Secondary | ICD-10-CM | POA: Diagnosis not present

## 2020-05-02 DIAGNOSIS — R55 Syncope and collapse: Secondary | ICD-10-CM | POA: Diagnosis not present

## 2020-05-02 DIAGNOSIS — E782 Mixed hyperlipidemia: Secondary | ICD-10-CM | POA: Diagnosis not present

## 2020-05-02 DIAGNOSIS — I2510 Atherosclerotic heart disease of native coronary artery without angina pectoris: Secondary | ICD-10-CM | POA: Diagnosis not present

## 2020-05-02 DIAGNOSIS — Z87891 Personal history of nicotine dependence: Secondary | ICD-10-CM | POA: Diagnosis not present

## 2020-05-02 DIAGNOSIS — I350 Nonrheumatic aortic (valve) stenosis: Secondary | ICD-10-CM | POA: Diagnosis not present

## 2020-05-19 DIAGNOSIS — Z79899 Other long term (current) drug therapy: Secondary | ICD-10-CM | POA: Diagnosis not present

## 2020-05-19 DIAGNOSIS — E782 Mixed hyperlipidemia: Secondary | ICD-10-CM | POA: Diagnosis not present

## 2020-05-19 DIAGNOSIS — E1169 Type 2 diabetes mellitus with other specified complication: Secondary | ICD-10-CM | POA: Diagnosis not present

## 2020-05-26 DIAGNOSIS — E785 Hyperlipidemia, unspecified: Secondary | ICD-10-CM | POA: Diagnosis not present

## 2020-05-26 DIAGNOSIS — E1169 Type 2 diabetes mellitus with other specified complication: Secondary | ICD-10-CM | POA: Diagnosis not present

## 2020-05-26 DIAGNOSIS — E782 Mixed hyperlipidemia: Secondary | ICD-10-CM | POA: Diagnosis not present

## 2020-05-26 DIAGNOSIS — L219 Seborrheic dermatitis, unspecified: Secondary | ICD-10-CM | POA: Diagnosis not present

## 2020-05-26 DIAGNOSIS — Z0000 Encounter for general adult medical examination without abnormal findings: Secondary | ICD-10-CM | POA: Diagnosis not present

## 2020-05-31 ENCOUNTER — Other Ambulatory Visit: Payer: Self-pay | Admitting: Family Medicine

## 2020-06-12 DIAGNOSIS — H2513 Age-related nuclear cataract, bilateral: Secondary | ICD-10-CM | POA: Diagnosis not present

## 2020-06-12 DIAGNOSIS — H34232 Retinal artery branch occlusion, left eye: Secondary | ICD-10-CM | POA: Diagnosis not present

## 2020-06-12 DIAGNOSIS — H02811 Retained foreign body in right upper eyelid: Secondary | ICD-10-CM | POA: Diagnosis not present

## 2020-06-24 DIAGNOSIS — Z6831 Body mass index (BMI) 31.0-31.9, adult: Secondary | ICD-10-CM | POA: Diagnosis not present

## 2020-06-24 DIAGNOSIS — M5442 Lumbago with sciatica, left side: Secondary | ICD-10-CM | POA: Diagnosis not present

## 2020-06-30 ENCOUNTER — Other Ambulatory Visit: Payer: Self-pay | Admitting: Physician Assistant

## 2020-07-08 DIAGNOSIS — M5416 Radiculopathy, lumbar region: Secondary | ICD-10-CM | POA: Diagnosis not present

## 2020-07-23 DIAGNOSIS — H5213 Myopia, bilateral: Secondary | ICD-10-CM | POA: Diagnosis not present

## 2020-07-23 DIAGNOSIS — H2513 Age-related nuclear cataract, bilateral: Secondary | ICD-10-CM | POA: Diagnosis not present

## 2020-07-23 DIAGNOSIS — H52223 Regular astigmatism, bilateral: Secondary | ICD-10-CM | POA: Diagnosis not present

## 2020-07-23 DIAGNOSIS — H34232 Retinal artery branch occlusion, left eye: Secondary | ICD-10-CM | POA: Diagnosis not present

## 2020-08-03 DIAGNOSIS — Z4509 Encounter for adjustment and management of other cardiac device: Secondary | ICD-10-CM | POA: Diagnosis not present

## 2020-09-18 DIAGNOSIS — I10 Essential (primary) hypertension: Secondary | ICD-10-CM | POA: Diagnosis not present

## 2020-09-18 DIAGNOSIS — R519 Headache, unspecified: Secondary | ICD-10-CM | POA: Diagnosis not present

## 2020-09-18 DIAGNOSIS — J329 Chronic sinusitis, unspecified: Secondary | ICD-10-CM | POA: Diagnosis not present

## 2020-09-18 DIAGNOSIS — J40 Bronchitis, not specified as acute or chronic: Secondary | ICD-10-CM | POA: Diagnosis not present

## 2020-09-29 DIAGNOSIS — Z8673 Personal history of transient ischemic attack (TIA), and cerebral infarction without residual deficits: Secondary | ICD-10-CM | POA: Diagnosis not present

## 2020-11-01 DIAGNOSIS — I639 Cerebral infarction, unspecified: Secondary | ICD-10-CM | POA: Diagnosis not present

## 2020-11-19 DIAGNOSIS — E785 Hyperlipidemia, unspecified: Secondary | ICD-10-CM | POA: Diagnosis not present

## 2020-11-19 DIAGNOSIS — Z79899 Other long term (current) drug therapy: Secondary | ICD-10-CM | POA: Diagnosis not present

## 2020-11-19 DIAGNOSIS — E1169 Type 2 diabetes mellitus with other specified complication: Secondary | ICD-10-CM | POA: Diagnosis not present

## 2020-11-21 DIAGNOSIS — E782 Mixed hyperlipidemia: Secondary | ICD-10-CM | POA: Diagnosis not present

## 2020-11-21 DIAGNOSIS — Z95818 Presence of other cardiac implants and grafts: Secondary | ICD-10-CM | POA: Diagnosis not present

## 2020-11-21 DIAGNOSIS — I252 Old myocardial infarction: Secondary | ICD-10-CM | POA: Diagnosis not present

## 2020-11-21 DIAGNOSIS — Z8673 Personal history of transient ischemic attack (TIA), and cerebral infarction without residual deficits: Secondary | ICD-10-CM | POA: Diagnosis not present

## 2020-11-21 DIAGNOSIS — I2510 Atherosclerotic heart disease of native coronary artery without angina pectoris: Secondary | ICD-10-CM | POA: Diagnosis not present

## 2020-11-21 DIAGNOSIS — I350 Nonrheumatic aortic (valve) stenosis: Secondary | ICD-10-CM | POA: Diagnosis not present

## 2020-11-21 DIAGNOSIS — I10 Essential (primary) hypertension: Secondary | ICD-10-CM | POA: Diagnosis not present

## 2020-11-21 DIAGNOSIS — I6523 Occlusion and stenosis of bilateral carotid arteries: Secondary | ICD-10-CM | POA: Diagnosis not present

## 2020-11-26 DIAGNOSIS — I10 Essential (primary) hypertension: Secondary | ICD-10-CM | POA: Diagnosis not present

## 2020-11-26 DIAGNOSIS — E782 Mixed hyperlipidemia: Secondary | ICD-10-CM | POA: Diagnosis not present

## 2020-11-26 DIAGNOSIS — E785 Hyperlipidemia, unspecified: Secondary | ICD-10-CM | POA: Diagnosis not present

## 2020-11-26 DIAGNOSIS — I6523 Occlusion and stenosis of bilateral carotid arteries: Secondary | ICD-10-CM | POA: Diagnosis not present

## 2020-11-26 DIAGNOSIS — Z8673 Personal history of transient ischemic attack (TIA), and cerebral infarction without residual deficits: Secondary | ICD-10-CM | POA: Diagnosis not present

## 2020-11-26 DIAGNOSIS — Z23 Encounter for immunization: Secondary | ICD-10-CM | POA: Diagnosis not present

## 2020-11-26 DIAGNOSIS — E119 Type 2 diabetes mellitus without complications: Secondary | ICD-10-CM | POA: Diagnosis not present

## 2020-11-26 DIAGNOSIS — E1169 Type 2 diabetes mellitus with other specified complication: Secondary | ICD-10-CM | POA: Diagnosis not present

## 2020-12-12 DIAGNOSIS — S61219A Laceration without foreign body of unspecified finger without damage to nail, initial encounter: Secondary | ICD-10-CM | POA: Diagnosis not present

## 2020-12-12 DIAGNOSIS — Z6832 Body mass index (BMI) 32.0-32.9, adult: Secondary | ICD-10-CM | POA: Diagnosis not present

## 2020-12-22 DIAGNOSIS — Z96642 Presence of left artificial hip joint: Secondary | ICD-10-CM | POA: Diagnosis not present

## 2021-01-27 DIAGNOSIS — Z1231 Encounter for screening mammogram for malignant neoplasm of breast: Secondary | ICD-10-CM | POA: Diagnosis not present

## 2021-02-04 DIAGNOSIS — M545 Low back pain, unspecified: Secondary | ICD-10-CM | POA: Diagnosis not present

## 2021-02-04 DIAGNOSIS — M2560 Stiffness of unspecified joint, not elsewhere classified: Secondary | ICD-10-CM | POA: Diagnosis not present

## 2021-02-04 DIAGNOSIS — Z96642 Presence of left artificial hip joint: Secondary | ICD-10-CM | POA: Diagnosis not present

## 2021-02-12 DIAGNOSIS — M545 Low back pain, unspecified: Secondary | ICD-10-CM | POA: Diagnosis not present

## 2021-02-12 DIAGNOSIS — Z96642 Presence of left artificial hip joint: Secondary | ICD-10-CM | POA: Diagnosis not present

## 2021-02-12 DIAGNOSIS — M2560 Stiffness of unspecified joint, not elsewhere classified: Secondary | ICD-10-CM | POA: Diagnosis not present

## 2021-02-18 DIAGNOSIS — M2560 Stiffness of unspecified joint, not elsewhere classified: Secondary | ICD-10-CM | POA: Diagnosis not present

## 2021-02-18 DIAGNOSIS — M545 Low back pain, unspecified: Secondary | ICD-10-CM | POA: Diagnosis not present

## 2021-02-18 DIAGNOSIS — Z96642 Presence of left artificial hip joint: Secondary | ICD-10-CM | POA: Diagnosis not present

## 2021-02-20 DIAGNOSIS — M2560 Stiffness of unspecified joint, not elsewhere classified: Secondary | ICD-10-CM | POA: Diagnosis not present

## 2021-02-20 DIAGNOSIS — Z96642 Presence of left artificial hip joint: Secondary | ICD-10-CM | POA: Diagnosis not present

## 2021-02-20 DIAGNOSIS — M545 Low back pain, unspecified: Secondary | ICD-10-CM | POA: Diagnosis not present

## 2021-02-24 DIAGNOSIS — Z96642 Presence of left artificial hip joint: Secondary | ICD-10-CM | POA: Diagnosis not present

## 2021-02-24 DIAGNOSIS — M2560 Stiffness of unspecified joint, not elsewhere classified: Secondary | ICD-10-CM | POA: Diagnosis not present

## 2021-02-24 DIAGNOSIS — M545 Low back pain, unspecified: Secondary | ICD-10-CM | POA: Diagnosis not present

## 2021-02-27 DIAGNOSIS — M545 Low back pain, unspecified: Secondary | ICD-10-CM | POA: Diagnosis not present

## 2021-02-27 DIAGNOSIS — Z96642 Presence of left artificial hip joint: Secondary | ICD-10-CM | POA: Diagnosis not present

## 2021-02-27 DIAGNOSIS — M2560 Stiffness of unspecified joint, not elsewhere classified: Secondary | ICD-10-CM | POA: Diagnosis not present

## 2021-03-03 DIAGNOSIS — Z96642 Presence of left artificial hip joint: Secondary | ICD-10-CM | POA: Diagnosis not present

## 2021-03-03 DIAGNOSIS — M545 Low back pain, unspecified: Secondary | ICD-10-CM | POA: Diagnosis not present

## 2021-03-03 DIAGNOSIS — M2560 Stiffness of unspecified joint, not elsewhere classified: Secondary | ICD-10-CM | POA: Diagnosis not present

## 2021-03-06 DIAGNOSIS — Z96642 Presence of left artificial hip joint: Secondary | ICD-10-CM | POA: Diagnosis not present

## 2021-03-06 DIAGNOSIS — M545 Low back pain, unspecified: Secondary | ICD-10-CM | POA: Diagnosis not present

## 2021-03-06 DIAGNOSIS — M2560 Stiffness of unspecified joint, not elsewhere classified: Secondary | ICD-10-CM | POA: Diagnosis not present

## 2021-03-10 DIAGNOSIS — Z96642 Presence of left artificial hip joint: Secondary | ICD-10-CM | POA: Diagnosis not present

## 2021-03-10 DIAGNOSIS — M2560 Stiffness of unspecified joint, not elsewhere classified: Secondary | ICD-10-CM | POA: Diagnosis not present

## 2021-03-10 DIAGNOSIS — M545 Low back pain, unspecified: Secondary | ICD-10-CM | POA: Diagnosis not present

## 2021-03-13 DIAGNOSIS — M2560 Stiffness of unspecified joint, not elsewhere classified: Secondary | ICD-10-CM | POA: Diagnosis not present

## 2021-03-13 DIAGNOSIS — M545 Low back pain, unspecified: Secondary | ICD-10-CM | POA: Diagnosis not present

## 2021-03-13 DIAGNOSIS — Z96642 Presence of left artificial hip joint: Secondary | ICD-10-CM | POA: Diagnosis not present

## 2021-03-17 DIAGNOSIS — M2560 Stiffness of unspecified joint, not elsewhere classified: Secondary | ICD-10-CM | POA: Diagnosis not present

## 2021-03-17 DIAGNOSIS — M545 Low back pain, unspecified: Secondary | ICD-10-CM | POA: Diagnosis not present

## 2021-03-17 DIAGNOSIS — Z96642 Presence of left artificial hip joint: Secondary | ICD-10-CM | POA: Diagnosis not present

## 2021-03-19 DIAGNOSIS — J0120 Acute ethmoidal sinusitis, unspecified: Secondary | ICD-10-CM | POA: Diagnosis not present

## 2021-03-19 DIAGNOSIS — Z20828 Contact with and (suspected) exposure to other viral communicable diseases: Secondary | ICD-10-CM | POA: Diagnosis not present

## 2021-04-01 DIAGNOSIS — Z96642 Presence of left artificial hip joint: Secondary | ICD-10-CM | POA: Diagnosis not present

## 2021-04-01 DIAGNOSIS — M2560 Stiffness of unspecified joint, not elsewhere classified: Secondary | ICD-10-CM | POA: Diagnosis not present

## 2021-04-01 DIAGNOSIS — M545 Low back pain, unspecified: Secondary | ICD-10-CM | POA: Diagnosis not present

## 2021-04-08 DIAGNOSIS — Z96642 Presence of left artificial hip joint: Secondary | ICD-10-CM | POA: Diagnosis not present

## 2021-04-08 DIAGNOSIS — M545 Low back pain, unspecified: Secondary | ICD-10-CM | POA: Diagnosis not present

## 2021-04-08 DIAGNOSIS — M2560 Stiffness of unspecified joint, not elsewhere classified: Secondary | ICD-10-CM | POA: Diagnosis not present

## 2021-04-15 DIAGNOSIS — M545 Low back pain, unspecified: Secondary | ICD-10-CM | POA: Diagnosis not present

## 2021-04-15 DIAGNOSIS — Z96642 Presence of left artificial hip joint: Secondary | ICD-10-CM | POA: Diagnosis not present

## 2021-04-15 DIAGNOSIS — M2560 Stiffness of unspecified joint, not elsewhere classified: Secondary | ICD-10-CM | POA: Diagnosis not present

## 2021-05-08 DIAGNOSIS — Z4509 Encounter for adjustment and management of other cardiac device: Secondary | ICD-10-CM | POA: Diagnosis not present

## 2021-05-29 DIAGNOSIS — E1169 Type 2 diabetes mellitus with other specified complication: Secondary | ICD-10-CM | POA: Diagnosis not present

## 2021-05-29 DIAGNOSIS — Z0000 Encounter for general adult medical examination without abnormal findings: Secondary | ICD-10-CM | POA: Diagnosis not present

## 2021-05-29 DIAGNOSIS — E785 Hyperlipidemia, unspecified: Secondary | ICD-10-CM | POA: Diagnosis not present

## 2021-05-29 DIAGNOSIS — I25119 Atherosclerotic heart disease of native coronary artery with unspecified angina pectoris: Secondary | ICD-10-CM | POA: Diagnosis not present

## 2021-05-29 DIAGNOSIS — Z86010 Personal history of colonic polyps: Secondary | ICD-10-CM | POA: Diagnosis not present

## 2021-06-01 DIAGNOSIS — I639 Cerebral infarction, unspecified: Secondary | ICD-10-CM | POA: Diagnosis not present

## 2021-06-02 DIAGNOSIS — I2510 Atherosclerotic heart disease of native coronary artery without angina pectoris: Secondary | ICD-10-CM | POA: Diagnosis not present

## 2021-06-02 DIAGNOSIS — E782 Mixed hyperlipidemia: Secondary | ICD-10-CM | POA: Diagnosis not present

## 2021-06-02 DIAGNOSIS — I6523 Occlusion and stenosis of bilateral carotid arteries: Secondary | ICD-10-CM | POA: Diagnosis not present

## 2021-06-02 DIAGNOSIS — Z95818 Presence of other cardiac implants and grafts: Secondary | ICD-10-CM | POA: Diagnosis not present

## 2021-06-02 DIAGNOSIS — I350 Nonrheumatic aortic (valve) stenosis: Secondary | ICD-10-CM | POA: Diagnosis not present

## 2021-06-02 DIAGNOSIS — I252 Old myocardial infarction: Secondary | ICD-10-CM | POA: Diagnosis not present

## 2021-06-29 DIAGNOSIS — Z1211 Encounter for screening for malignant neoplasm of colon: Secondary | ICD-10-CM | POA: Diagnosis not present

## 2021-06-29 DIAGNOSIS — Z86010 Personal history of colonic polyps: Secondary | ICD-10-CM | POA: Diagnosis not present

## 2021-07-22 DIAGNOSIS — Z09 Encounter for follow-up examination after completed treatment for conditions other than malignant neoplasm: Secondary | ICD-10-CM | POA: Diagnosis not present

## 2021-07-22 DIAGNOSIS — Z7984 Long term (current) use of oral hypoglycemic drugs: Secondary | ICD-10-CM | POA: Diagnosis not present

## 2021-07-22 DIAGNOSIS — E119 Type 2 diabetes mellitus without complications: Secondary | ICD-10-CM | POA: Diagnosis not present

## 2021-07-22 DIAGNOSIS — Z1211 Encounter for screening for malignant neoplasm of colon: Secondary | ICD-10-CM | POA: Diagnosis not present

## 2021-07-22 DIAGNOSIS — Z7982 Long term (current) use of aspirin: Secondary | ICD-10-CM | POA: Diagnosis not present

## 2021-07-22 DIAGNOSIS — Z86010 Personal history of colonic polyps: Secondary | ICD-10-CM | POA: Diagnosis not present

## 2021-07-22 DIAGNOSIS — Z7902 Long term (current) use of antithrombotics/antiplatelets: Secondary | ICD-10-CM | POA: Diagnosis not present

## 2021-07-22 DIAGNOSIS — I10 Essential (primary) hypertension: Secondary | ICD-10-CM | POA: Diagnosis not present

## 2021-07-22 DIAGNOSIS — Z87891 Personal history of nicotine dependence: Secondary | ICD-10-CM | POA: Diagnosis not present

## 2021-08-13 DIAGNOSIS — L255 Unspecified contact dermatitis due to plants, except food: Secondary | ICD-10-CM | POA: Diagnosis not present

## 2021-08-13 DIAGNOSIS — Z6831 Body mass index (BMI) 31.0-31.9, adult: Secondary | ICD-10-CM | POA: Diagnosis not present

## 2021-08-27 DIAGNOSIS — Z4509 Encounter for adjustment and management of other cardiac device: Secondary | ICD-10-CM | POA: Diagnosis not present

## 2021-11-03 DIAGNOSIS — H34232 Retinal artery branch occlusion, left eye: Secondary | ICD-10-CM | POA: Diagnosis not present

## 2021-11-20 DIAGNOSIS — Z6832 Body mass index (BMI) 32.0-32.9, adult: Secondary | ICD-10-CM | POA: Diagnosis not present

## 2021-11-20 DIAGNOSIS — M542 Cervicalgia: Secondary | ICD-10-CM | POA: Diagnosis not present

## 2021-11-20 DIAGNOSIS — I25119 Atherosclerotic heart disease of native coronary artery with unspecified angina pectoris: Secondary | ICD-10-CM | POA: Diagnosis not present

## 2021-11-20 DIAGNOSIS — G44209 Tension-type headache, unspecified, not intractable: Secondary | ICD-10-CM | POA: Diagnosis not present

## 2021-11-23 DIAGNOSIS — Z79899 Other long term (current) drug therapy: Secondary | ICD-10-CM | POA: Diagnosis not present

## 2021-11-23 DIAGNOSIS — E039 Hypothyroidism, unspecified: Secondary | ICD-10-CM | POA: Diagnosis not present

## 2021-11-23 DIAGNOSIS — E782 Mixed hyperlipidemia: Secondary | ICD-10-CM | POA: Diagnosis not present

## 2021-11-23 DIAGNOSIS — E1169 Type 2 diabetes mellitus with other specified complication: Secondary | ICD-10-CM | POA: Diagnosis not present

## 2021-11-23 DIAGNOSIS — E785 Hyperlipidemia, unspecified: Secondary | ICD-10-CM | POA: Diagnosis not present

## 2021-11-30 DIAGNOSIS — I252 Old myocardial infarction: Secondary | ICD-10-CM | POA: Diagnosis not present

## 2021-11-30 DIAGNOSIS — I6523 Occlusion and stenosis of bilateral carotid arteries: Secondary | ICD-10-CM | POA: Diagnosis not present

## 2021-11-30 DIAGNOSIS — E1169 Type 2 diabetes mellitus with other specified complication: Secondary | ICD-10-CM | POA: Diagnosis not present

## 2021-11-30 DIAGNOSIS — Z8673 Personal history of transient ischemic attack (TIA), and cerebral infarction without residual deficits: Secondary | ICD-10-CM | POA: Diagnosis not present

## 2021-11-30 DIAGNOSIS — I350 Nonrheumatic aortic (valve) stenosis: Secondary | ICD-10-CM | POA: Diagnosis not present

## 2021-11-30 DIAGNOSIS — E782 Mixed hyperlipidemia: Secondary | ICD-10-CM | POA: Diagnosis not present

## 2021-11-30 DIAGNOSIS — I25119 Atherosclerotic heart disease of native coronary artery with unspecified angina pectoris: Secondary | ICD-10-CM | POA: Diagnosis not present

## 2021-11-30 DIAGNOSIS — E785 Hyperlipidemia, unspecified: Secondary | ICD-10-CM | POA: Diagnosis not present

## 2021-11-30 DIAGNOSIS — Z95818 Presence of other cardiac implants and grafts: Secondary | ICD-10-CM | POA: Diagnosis not present

## 2021-11-30 DIAGNOSIS — I10 Essential (primary) hypertension: Secondary | ICD-10-CM | POA: Diagnosis not present

## 2021-11-30 DIAGNOSIS — I2510 Atherosclerotic heart disease of native coronary artery without angina pectoris: Secondary | ICD-10-CM | POA: Diagnosis not present

## 2021-12-03 DIAGNOSIS — E119 Type 2 diabetes mellitus without complications: Secondary | ICD-10-CM | POA: Diagnosis not present

## 2021-12-03 DIAGNOSIS — I10 Essential (primary) hypertension: Secondary | ICD-10-CM | POA: Diagnosis not present

## 2021-12-03 DIAGNOSIS — M5481 Occipital neuralgia: Secondary | ICD-10-CM | POA: Diagnosis not present

## 2021-12-03 DIAGNOSIS — Z8673 Personal history of transient ischemic attack (TIA), and cerebral infarction without residual deficits: Secondary | ICD-10-CM | POA: Diagnosis not present

## 2021-12-24 DIAGNOSIS — M25522 Pain in left elbow: Secondary | ICD-10-CM | POA: Diagnosis not present

## 2022-01-15 DIAGNOSIS — Z4509 Encounter for adjustment and management of other cardiac device: Secondary | ICD-10-CM | POA: Diagnosis not present

## 2022-01-15 DIAGNOSIS — Z8673 Personal history of transient ischemic attack (TIA), and cerebral infarction without residual deficits: Secondary | ICD-10-CM | POA: Diagnosis not present

## 2022-01-21 DIAGNOSIS — H34232 Retinal artery branch occlusion, left eye: Secondary | ICD-10-CM | POA: Diagnosis not present

## 2022-01-21 DIAGNOSIS — H2511 Age-related nuclear cataract, right eye: Secondary | ICD-10-CM | POA: Diagnosis not present

## 2022-01-21 DIAGNOSIS — H25043 Posterior subcapsular polar age-related cataract, bilateral: Secondary | ICD-10-CM | POA: Diagnosis not present

## 2022-01-21 DIAGNOSIS — H25013 Cortical age-related cataract, bilateral: Secondary | ICD-10-CM | POA: Diagnosis not present

## 2022-01-21 DIAGNOSIS — H18413 Arcus senilis, bilateral: Secondary | ICD-10-CM | POA: Diagnosis not present

## 2022-01-21 DIAGNOSIS — H2513 Age-related nuclear cataract, bilateral: Secondary | ICD-10-CM | POA: Diagnosis not present

## 2022-02-11 DIAGNOSIS — J069 Acute upper respiratory infection, unspecified: Secondary | ICD-10-CM | POA: Diagnosis not present

## 2022-03-12 DIAGNOSIS — Z8673 Personal history of transient ischemic attack (TIA), and cerebral infarction without residual deficits: Secondary | ICD-10-CM | POA: Diagnosis not present

## 2022-03-12 DIAGNOSIS — Z4509 Encounter for adjustment and management of other cardiac device: Secondary | ICD-10-CM | POA: Diagnosis not present

## 2022-03-23 DIAGNOSIS — Z1231 Encounter for screening mammogram for malignant neoplasm of breast: Secondary | ICD-10-CM | POA: Diagnosis not present

## 2022-03-24 DIAGNOSIS — I2510 Atherosclerotic heart disease of native coronary artery without angina pectoris: Secondary | ICD-10-CM | POA: Diagnosis not present

## 2022-03-24 DIAGNOSIS — I350 Nonrheumatic aortic (valve) stenosis: Secondary | ICD-10-CM | POA: Diagnosis not present

## 2022-04-14 DIAGNOSIS — Z8673 Personal history of transient ischemic attack (TIA), and cerebral infarction without residual deficits: Secondary | ICD-10-CM | POA: Diagnosis not present

## 2022-04-14 DIAGNOSIS — Z4509 Encounter for adjustment and management of other cardiac device: Secondary | ICD-10-CM | POA: Diagnosis not present

## 2022-05-10 DIAGNOSIS — H2511 Age-related nuclear cataract, right eye: Secondary | ICD-10-CM | POA: Diagnosis not present

## 2022-05-10 DIAGNOSIS — H52201 Unspecified astigmatism, right eye: Secondary | ICD-10-CM | POA: Diagnosis not present

## 2022-05-10 DIAGNOSIS — H34232 Retinal artery branch occlusion, left eye: Secondary | ICD-10-CM | POA: Diagnosis not present

## 2022-05-10 DIAGNOSIS — H43393 Other vitreous opacities, bilateral: Secondary | ICD-10-CM | POA: Diagnosis not present

## 2022-05-11 DIAGNOSIS — H2512 Age-related nuclear cataract, left eye: Secondary | ICD-10-CM | POA: Diagnosis not present

## 2022-05-11 DIAGNOSIS — H25012 Cortical age-related cataract, left eye: Secondary | ICD-10-CM | POA: Diagnosis not present

## 2022-05-11 DIAGNOSIS — H25042 Posterior subcapsular polar age-related cataract, left eye: Secondary | ICD-10-CM | POA: Diagnosis not present

## 2022-05-25 DIAGNOSIS — E785 Hyperlipidemia, unspecified: Secondary | ICD-10-CM | POA: Diagnosis not present

## 2022-05-25 DIAGNOSIS — Z4509 Encounter for adjustment and management of other cardiac device: Secondary | ICD-10-CM | POA: Diagnosis not present

## 2022-05-25 DIAGNOSIS — Z79899 Other long term (current) drug therapy: Secondary | ICD-10-CM | POA: Diagnosis not present

## 2022-05-25 DIAGNOSIS — E1169 Type 2 diabetes mellitus with other specified complication: Secondary | ICD-10-CM | POA: Diagnosis not present

## 2022-06-01 DIAGNOSIS — Z0000 Encounter for general adult medical examination without abnormal findings: Secondary | ICD-10-CM | POA: Diagnosis not present

## 2022-06-01 DIAGNOSIS — E785 Hyperlipidemia, unspecified: Secondary | ICD-10-CM | POA: Diagnosis not present

## 2022-06-01 DIAGNOSIS — E782 Mixed hyperlipidemia: Secondary | ICD-10-CM | POA: Diagnosis not present

## 2022-06-01 DIAGNOSIS — E1169 Type 2 diabetes mellitus with other specified complication: Secondary | ICD-10-CM | POA: Diagnosis not present

## 2022-06-01 DIAGNOSIS — I10 Essential (primary) hypertension: Secondary | ICD-10-CM | POA: Diagnosis not present

## 2022-06-07 DIAGNOSIS — Z95818 Presence of other cardiac implants and grafts: Secondary | ICD-10-CM | POA: Diagnosis not present

## 2022-06-07 DIAGNOSIS — I350 Nonrheumatic aortic (valve) stenosis: Secondary | ICD-10-CM | POA: Diagnosis not present

## 2022-06-07 DIAGNOSIS — E782 Mixed hyperlipidemia: Secondary | ICD-10-CM | POA: Diagnosis not present

## 2022-06-07 DIAGNOSIS — I252 Old myocardial infarction: Secondary | ICD-10-CM | POA: Diagnosis not present

## 2022-06-07 DIAGNOSIS — I2510 Atherosclerotic heart disease of native coronary artery without angina pectoris: Secondary | ICD-10-CM | POA: Diagnosis not present

## 2022-06-07 DIAGNOSIS — Z8673 Personal history of transient ischemic attack (TIA), and cerebral infarction without residual deficits: Secondary | ICD-10-CM | POA: Diagnosis not present

## 2022-06-14 DIAGNOSIS — H52202 Unspecified astigmatism, left eye: Secondary | ICD-10-CM | POA: Diagnosis not present

## 2022-06-14 DIAGNOSIS — H2512 Age-related nuclear cataract, left eye: Secondary | ICD-10-CM | POA: Diagnosis not present

## 2022-07-28 DIAGNOSIS — S0003XA Contusion of scalp, initial encounter: Secondary | ICD-10-CM | POA: Diagnosis not present

## 2022-08-05 DIAGNOSIS — S2020XD Contusion of thorax, unspecified, subsequent encounter: Secondary | ICD-10-CM | POA: Diagnosis not present

## 2022-08-05 DIAGNOSIS — Z6832 Body mass index (BMI) 32.0-32.9, adult: Secondary | ICD-10-CM | POA: Diagnosis not present

## 2022-08-20 DIAGNOSIS — R001 Bradycardia, unspecified: Secondary | ICD-10-CM | POA: Diagnosis not present

## 2022-08-20 DIAGNOSIS — Z95818 Presence of other cardiac implants and grafts: Secondary | ICD-10-CM | POA: Diagnosis not present

## 2022-09-01 DIAGNOSIS — Z6832 Body mass index (BMI) 32.0-32.9, adult: Secondary | ICD-10-CM | POA: Diagnosis not present

## 2022-09-01 DIAGNOSIS — M25511 Pain in right shoulder: Secondary | ICD-10-CM | POA: Diagnosis not present

## 2022-09-01 DIAGNOSIS — R0600 Dyspnea, unspecified: Secondary | ICD-10-CM | POA: Diagnosis not present

## 2022-09-01 DIAGNOSIS — G8911 Acute pain due to trauma: Secondary | ICD-10-CM | POA: Diagnosis not present

## 2022-10-19 DIAGNOSIS — Z4509 Encounter for adjustment and management of other cardiac device: Secondary | ICD-10-CM | POA: Diagnosis not present

## 2022-11-18 DIAGNOSIS — Z8673 Personal history of transient ischemic attack (TIA), and cerebral infarction without residual deficits: Secondary | ICD-10-CM | POA: Diagnosis not present

## 2022-11-18 DIAGNOSIS — M5481 Occipital neuralgia: Secondary | ICD-10-CM | POA: Diagnosis not present

## 2022-11-18 DIAGNOSIS — G253 Myoclonus: Secondary | ICD-10-CM | POA: Diagnosis not present

## 2022-11-23 DIAGNOSIS — Z4509 Encounter for adjustment and management of other cardiac device: Secondary | ICD-10-CM | POA: Diagnosis not present

## 2022-11-23 DIAGNOSIS — Z8673 Personal history of transient ischemic attack (TIA), and cerebral infarction without residual deficits: Secondary | ICD-10-CM | POA: Diagnosis not present

## 2022-11-24 DIAGNOSIS — E782 Mixed hyperlipidemia: Secondary | ICD-10-CM | POA: Diagnosis not present

## 2022-11-24 DIAGNOSIS — E785 Hyperlipidemia, unspecified: Secondary | ICD-10-CM | POA: Diagnosis not present

## 2022-11-24 DIAGNOSIS — E039 Hypothyroidism, unspecified: Secondary | ICD-10-CM | POA: Diagnosis not present

## 2022-11-24 DIAGNOSIS — Z79899 Other long term (current) drug therapy: Secondary | ICD-10-CM | POA: Diagnosis not present

## 2022-11-24 DIAGNOSIS — E1169 Type 2 diabetes mellitus with other specified complication: Secondary | ICD-10-CM | POA: Diagnosis not present

## 2022-12-01 DIAGNOSIS — E785 Hyperlipidemia, unspecified: Secondary | ICD-10-CM | POA: Diagnosis not present

## 2022-12-01 DIAGNOSIS — I10 Essential (primary) hypertension: Secondary | ICD-10-CM | POA: Diagnosis not present

## 2022-12-01 DIAGNOSIS — E782 Mixed hyperlipidemia: Secondary | ICD-10-CM | POA: Diagnosis not present

## 2022-12-01 DIAGNOSIS — E1169 Type 2 diabetes mellitus with other specified complication: Secondary | ICD-10-CM | POA: Diagnosis not present

## 2022-12-08 DIAGNOSIS — I2510 Atherosclerotic heart disease of native coronary artery without angina pectoris: Secondary | ICD-10-CM | POA: Diagnosis not present

## 2022-12-08 DIAGNOSIS — I350 Nonrheumatic aortic (valve) stenosis: Secondary | ICD-10-CM | POA: Diagnosis not present

## 2022-12-08 DIAGNOSIS — I252 Old myocardial infarction: Secondary | ICD-10-CM | POA: Diagnosis not present

## 2022-12-08 DIAGNOSIS — Z95818 Presence of other cardiac implants and grafts: Secondary | ICD-10-CM | POA: Diagnosis not present

## 2022-12-08 DIAGNOSIS — Z8673 Personal history of transient ischemic attack (TIA), and cerebral infarction without residual deficits: Secondary | ICD-10-CM | POA: Diagnosis not present

## 2022-12-08 DIAGNOSIS — E782 Mixed hyperlipidemia: Secondary | ICD-10-CM | POA: Diagnosis not present

## 2022-12-27 DIAGNOSIS — Z96643 Presence of artificial hip joint, bilateral: Secondary | ICD-10-CM | POA: Diagnosis not present

## 2023-01-03 DIAGNOSIS — S0003XA Contusion of scalp, initial encounter: Secondary | ICD-10-CM | POA: Diagnosis not present

## 2023-01-03 DIAGNOSIS — Z6833 Body mass index (BMI) 33.0-33.9, adult: Secondary | ICD-10-CM | POA: Diagnosis not present

## 2023-01-26 DIAGNOSIS — J039 Acute tonsillitis, unspecified: Secondary | ICD-10-CM | POA: Diagnosis not present

## 2023-02-03 DIAGNOSIS — H43393 Other vitreous opacities, bilateral: Secondary | ICD-10-CM | POA: Diagnosis not present

## 2023-02-03 DIAGNOSIS — H34232 Retinal artery branch occlusion, left eye: Secondary | ICD-10-CM | POA: Diagnosis not present

## 2023-02-04 DIAGNOSIS — Z4509 Encounter for adjustment and management of other cardiac device: Secondary | ICD-10-CM | POA: Diagnosis not present

## 2023-02-04 DIAGNOSIS — Z8673 Personal history of transient ischemic attack (TIA), and cerebral infarction without residual deficits: Secondary | ICD-10-CM | POA: Diagnosis not present

## 2023-02-12 DIAGNOSIS — R300 Dysuria: Secondary | ICD-10-CM | POA: Diagnosis not present

## 2023-02-12 DIAGNOSIS — R350 Frequency of micturition: Secondary | ICD-10-CM | POA: Diagnosis not present

## 2023-03-13 DIAGNOSIS — Z4509 Encounter for adjustment and management of other cardiac device: Secondary | ICD-10-CM | POA: Diagnosis not present

## 2023-03-29 DIAGNOSIS — I272 Pulmonary hypertension, unspecified: Secondary | ICD-10-CM | POA: Diagnosis not present

## 2023-03-29 DIAGNOSIS — I361 Nonrheumatic tricuspid (valve) insufficiency: Secondary | ICD-10-CM | POA: Diagnosis not present

## 2023-03-30 DIAGNOSIS — M25561 Pain in right knee: Secondary | ICD-10-CM | POA: Diagnosis not present

## 2023-04-19 DIAGNOSIS — Z4509 Encounter for adjustment and management of other cardiac device: Secondary | ICD-10-CM | POA: Diagnosis not present

## 2023-04-25 DIAGNOSIS — Z1231 Encounter for screening mammogram for malignant neoplasm of breast: Secondary | ICD-10-CM | POA: Diagnosis not present

## 2023-05-27 DIAGNOSIS — Z4509 Encounter for adjustment and management of other cardiac device: Secondary | ICD-10-CM | POA: Diagnosis not present

## 2023-05-30 DIAGNOSIS — E1169 Type 2 diabetes mellitus with other specified complication: Secondary | ICD-10-CM | POA: Diagnosis not present

## 2023-05-30 DIAGNOSIS — Z79899 Other long term (current) drug therapy: Secondary | ICD-10-CM | POA: Diagnosis not present

## 2023-05-30 DIAGNOSIS — E785 Hyperlipidemia, unspecified: Secondary | ICD-10-CM | POA: Diagnosis not present

## 2023-06-10 DIAGNOSIS — E1169 Type 2 diabetes mellitus with other specified complication: Secondary | ICD-10-CM | POA: Diagnosis not present

## 2023-06-10 DIAGNOSIS — E785 Hyperlipidemia, unspecified: Secondary | ICD-10-CM | POA: Diagnosis not present

## 2023-06-10 DIAGNOSIS — L255 Unspecified contact dermatitis due to plants, except food: Secondary | ICD-10-CM | POA: Diagnosis not present

## 2023-06-10 DIAGNOSIS — I25119 Atherosclerotic heart disease of native coronary artery with unspecified angina pectoris: Secondary | ICD-10-CM | POA: Diagnosis not present

## 2023-06-10 DIAGNOSIS — Z0000 Encounter for general adult medical examination without abnormal findings: Secondary | ICD-10-CM | POA: Diagnosis not present

## 2023-06-10 DIAGNOSIS — E039 Hypothyroidism, unspecified: Secondary | ICD-10-CM | POA: Diagnosis not present

## 2023-06-15 DIAGNOSIS — Z6832 Body mass index (BMI) 32.0-32.9, adult: Secondary | ICD-10-CM | POA: Diagnosis not present

## 2023-06-15 DIAGNOSIS — L255 Unspecified contact dermatitis due to plants, except food: Secondary | ICD-10-CM | POA: Diagnosis not present

## 2023-07-04 DIAGNOSIS — I2510 Atherosclerotic heart disease of native coronary artery without angina pectoris: Secondary | ICD-10-CM | POA: Diagnosis not present

## 2023-07-04 DIAGNOSIS — I350 Nonrheumatic aortic (valve) stenosis: Secondary | ICD-10-CM | POA: Diagnosis not present

## 2023-07-04 DIAGNOSIS — E782 Mixed hyperlipidemia: Secondary | ICD-10-CM | POA: Diagnosis not present

## 2023-07-04 DIAGNOSIS — I252 Old myocardial infarction: Secondary | ICD-10-CM | POA: Diagnosis not present

## 2023-07-04 DIAGNOSIS — Z95818 Presence of other cardiac implants and grafts: Secondary | ICD-10-CM | POA: Diagnosis not present

## 2023-07-04 DIAGNOSIS — R300 Dysuria: Secondary | ICD-10-CM | POA: Diagnosis not present

## 2023-07-04 DIAGNOSIS — Z8673 Personal history of transient ischemic attack (TIA), and cerebral infarction without residual deficits: Secondary | ICD-10-CM | POA: Diagnosis not present

## 2023-07-21 DIAGNOSIS — Z4509 Encounter for adjustment and management of other cardiac device: Secondary | ICD-10-CM | POA: Diagnosis not present

## 2023-09-09 DIAGNOSIS — Z95818 Presence of other cardiac implants and grafts: Secondary | ICD-10-CM | POA: Diagnosis not present

## 2023-09-13 DIAGNOSIS — L728 Other follicular cysts of the skin and subcutaneous tissue: Secondary | ICD-10-CM | POA: Diagnosis not present

## 2023-09-13 DIAGNOSIS — L821 Other seborrheic keratosis: Secondary | ICD-10-CM | POA: Diagnosis not present

## 2023-10-29 DIAGNOSIS — Z95818 Presence of other cardiac implants and grafts: Secondary | ICD-10-CM | POA: Diagnosis not present

## 2023-11-21 DIAGNOSIS — H53461 Homonymous bilateral field defects, right side: Secondary | ICD-10-CM | POA: Diagnosis not present

## 2023-11-21 DIAGNOSIS — I69398 Other sequelae of cerebral infarction: Secondary | ICD-10-CM | POA: Diagnosis not present

## 2023-11-24 DIAGNOSIS — W19XXXA Unspecified fall, initial encounter: Secondary | ICD-10-CM | POA: Diagnosis not present

## 2023-11-24 DIAGNOSIS — M79671 Pain in right foot: Secondary | ICD-10-CM | POA: Diagnosis not present

## 2023-11-24 DIAGNOSIS — S40021A Contusion of right upper arm, initial encounter: Secondary | ICD-10-CM | POA: Diagnosis not present

## 2023-11-30 DIAGNOSIS — Z95818 Presence of other cardiac implants and grafts: Secondary | ICD-10-CM | POA: Diagnosis not present

## 2023-12-08 DIAGNOSIS — E039 Hypothyroidism, unspecified: Secondary | ICD-10-CM | POA: Diagnosis not present

## 2023-12-08 DIAGNOSIS — E1169 Type 2 diabetes mellitus with other specified complication: Secondary | ICD-10-CM | POA: Diagnosis not present

## 2023-12-08 DIAGNOSIS — E782 Mixed hyperlipidemia: Secondary | ICD-10-CM | POA: Diagnosis not present

## 2023-12-08 DIAGNOSIS — Z79899 Other long term (current) drug therapy: Secondary | ICD-10-CM | POA: Diagnosis not present

## 2023-12-08 DIAGNOSIS — E785 Hyperlipidemia, unspecified: Secondary | ICD-10-CM | POA: Diagnosis not present

## 2023-12-15 DIAGNOSIS — E1169 Type 2 diabetes mellitus with other specified complication: Secondary | ICD-10-CM | POA: Diagnosis not present

## 2023-12-15 DIAGNOSIS — E785 Hyperlipidemia, unspecified: Secondary | ICD-10-CM | POA: Diagnosis not present

## 2023-12-15 DIAGNOSIS — I10 Essential (primary) hypertension: Secondary | ICD-10-CM | POA: Diagnosis not present

## 2023-12-15 DIAGNOSIS — E782 Mixed hyperlipidemia: Secondary | ICD-10-CM | POA: Diagnosis not present

## 2023-12-27 DIAGNOSIS — Z96643 Presence of artificial hip joint, bilateral: Secondary | ICD-10-CM | POA: Diagnosis not present

## 2024-01-06 DIAGNOSIS — E782 Mixed hyperlipidemia: Secondary | ICD-10-CM | POA: Diagnosis not present

## 2024-01-06 DIAGNOSIS — I6523 Occlusion and stenosis of bilateral carotid arteries: Secondary | ICD-10-CM | POA: Diagnosis not present

## 2024-01-06 DIAGNOSIS — I2510 Atherosclerotic heart disease of native coronary artery without angina pectoris: Secondary | ICD-10-CM | POA: Diagnosis not present

## 2024-01-06 DIAGNOSIS — Z95818 Presence of other cardiac implants and grafts: Secondary | ICD-10-CM | POA: Diagnosis not present

## 2024-01-06 DIAGNOSIS — I350 Nonrheumatic aortic (valve) stenosis: Secondary | ICD-10-CM | POA: Diagnosis not present

## 2024-01-06 DIAGNOSIS — Z8673 Personal history of transient ischemic attack (TIA), and cerebral infarction without residual deficits: Secondary | ICD-10-CM | POA: Diagnosis not present
